# Patient Record
Sex: Female | Born: 1990 | Race: White | Hispanic: No | Marital: Married | Smoking: Never smoker
Health system: Southern US, Community
[De-identification: ages and names within clinical notes are randomized; demographics above are authoritative.]

## PROBLEM LIST (undated history)

## (undated) DIAGNOSIS — R112 Nausea with vomiting, unspecified: Secondary | ICD-10-CM

## (undated) DIAGNOSIS — Z9889 Other specified postprocedural states: Secondary | ICD-10-CM

## (undated) DIAGNOSIS — E039 Hypothyroidism, unspecified: Secondary | ICD-10-CM

## (undated) DIAGNOSIS — R87629 Unspecified abnormal cytological findings in specimens from vagina: Secondary | ICD-10-CM

## (undated) HISTORY — PX: NASAL SEPTUM SURGERY: SHX37

## (undated) HISTORY — PX: CHOLECYSTECTOMY: SHX55

## (undated) HISTORY — DX: Unspecified abnormal cytological findings in specimens from vagina: R87.629

---
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
## 2016-05-12 ENCOUNTER — Other Ambulatory Visit: Payer: Self-pay | Admitting: Endocrinology

## 2016-05-12 DIAGNOSIS — E010 Iodine-deficiency related diffuse (endemic) goiter: Secondary | ICD-10-CM

## 2016-05-26 ENCOUNTER — Ambulatory Visit
Admission: RE | Admit: 2016-05-26 | Discharge: 2016-05-26 | Disposition: A | Discharge status: Home / Self Care | Payer: Managed Care, Other (non HMO) | Source: Ambulatory Visit | Attending: Endocrinology | Admitting: Endocrinology

## 2016-05-26 DIAGNOSIS — E010 Iodine-deficiency related diffuse (endemic) goiter: Secondary | ICD-10-CM

## 2016-08-30 ENCOUNTER — Other Ambulatory Visit: Payer: Self-pay | Admitting: Gastroenterology

## 2016-08-30 DIAGNOSIS — R112 Nausea with vomiting, unspecified: Secondary | ICD-10-CM

## 2016-09-08 ENCOUNTER — Encounter (HOSPITAL_COMMUNITY)
Admission: RE | Admit: 2016-09-08 | Discharge: 2016-09-08 | Disposition: A | Discharge status: Home / Self Care | Payer: Commercial Managed Care - PPO | Source: Ambulatory Visit | Attending: Gastroenterology | Admitting: Gastroenterology

## 2016-09-08 DIAGNOSIS — R112 Nausea with vomiting, unspecified: Secondary | ICD-10-CM | POA: Insufficient documentation

## 2016-09-08 MED ORDER — TECHNETIUM TC 99M MEBROFENIN IV KIT: 5.2000 | PACK | Freq: Once | INTRAVENOUS | Status: DC | PRN

## 2016-09-14 ENCOUNTER — Encounter (HOSPITAL_COMMUNITY)
Admission: RE | Admit: 2016-09-14 | Discharge: 2016-09-14 | Disposition: A | Discharge status: Home / Self Care | Payer: Commercial Managed Care - PPO | Source: Ambulatory Visit | Attending: Gastroenterology | Admitting: Gastroenterology

## 2016-09-14 ENCOUNTER — Encounter (HOSPITAL_COMMUNITY): Payer: Self-pay | Admitting: Radiology

## 2016-09-14 DIAGNOSIS — R112 Nausea with vomiting, unspecified: Secondary | ICD-10-CM | POA: Insufficient documentation

## 2016-09-14 MED ORDER — TECHNETIUM TC 99M SULFUR COLLOID: 2.0000 | Freq: Once | INTRAVENOUS | Status: DC | PRN

## 2016-10-14 ENCOUNTER — Other Ambulatory Visit: Payer: Self-pay | Admitting: Surgery

## 2016-10-27 ENCOUNTER — Ambulatory Visit (HOSPITAL_COMMUNITY)
Admission: RE | Admit: 2016-10-27 | Discharge: 2016-10-27 | Disposition: A | Discharge status: Home / Self Care | Payer: Commercial Managed Care - PPO | Source: Ambulatory Visit | Attending: Surgery | Admitting: Surgery

## 2016-10-27 ENCOUNTER — Other Ambulatory Visit (HOSPITAL_COMMUNITY): Payer: Self-pay | Admitting: Surgery

## 2016-10-27 DIAGNOSIS — M79605 Pain in left leg: Secondary | ICD-10-CM

## 2016-10-27 DIAGNOSIS — M7989 Other specified soft tissue disorders: Principal | ICD-10-CM

## 2016-10-27 DIAGNOSIS — Z9049 Acquired absence of other specified parts of digestive tract: Secondary | ICD-10-CM | POA: Diagnosis present

## 2016-10-27 NOTE — Progress Notes (Signed)
*  PRELIMINARY RESULTS* Vascular Ultrasound Left lower extremity venous duplex has been completed.  Preliminary findings: No evidence of deep vein thrombosis or baker's cysts in the left lower extremity.  Preliminary report called to Dr. Doroteo GlassmanBlackmon's office and given to [facility] @ 16:40.   Chauncey FischerCharlotte C Dewell Monnier 10/27/2016, 4:39 PM

## 2017-03-24 IMAGING — US US ABDOMEN COMPLETE
1 series · 14 of 25 positions shown · non-contrast
Comparison: None.

CLINICAL DATA: Nausea, vomiting for several years

EXAM:
ABDOMEN ULTRASOUND COMPLETE

[Series 1: us abdomen complete · 0.20mm/px · 14 of 113 slices shown]
[im 1/113]
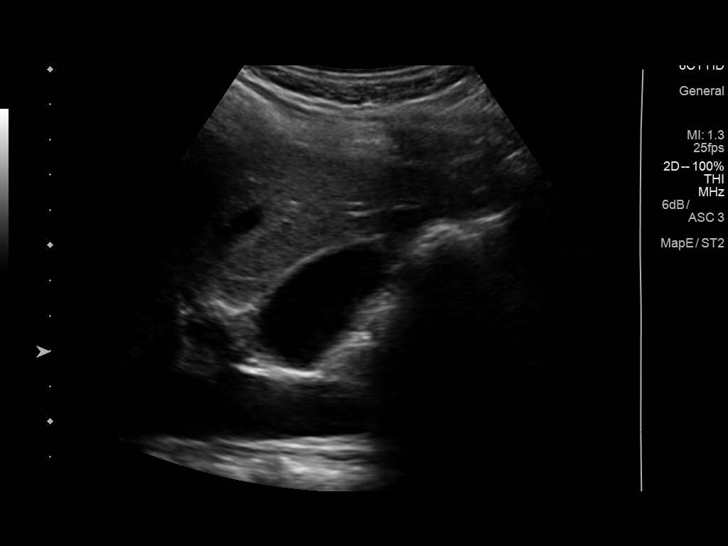
[im 10/113]
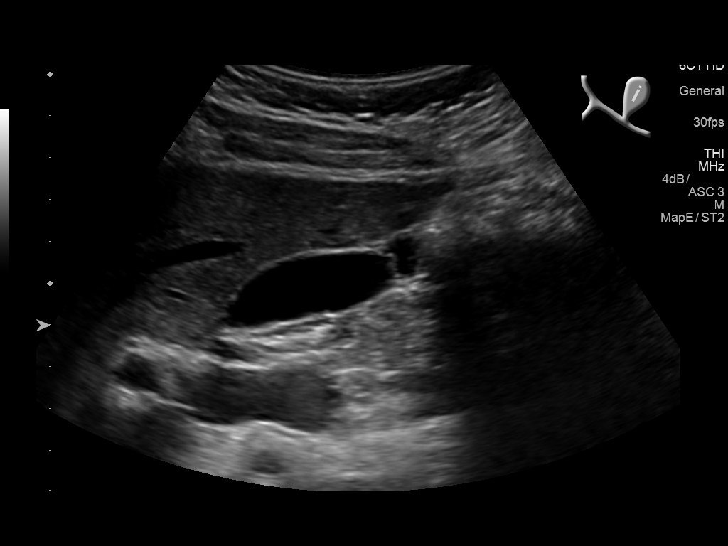
[im 19/113]
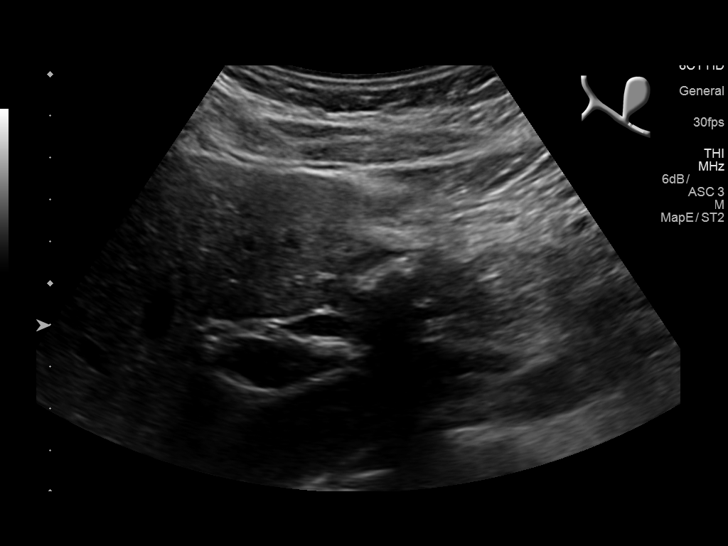
[im 29/113]
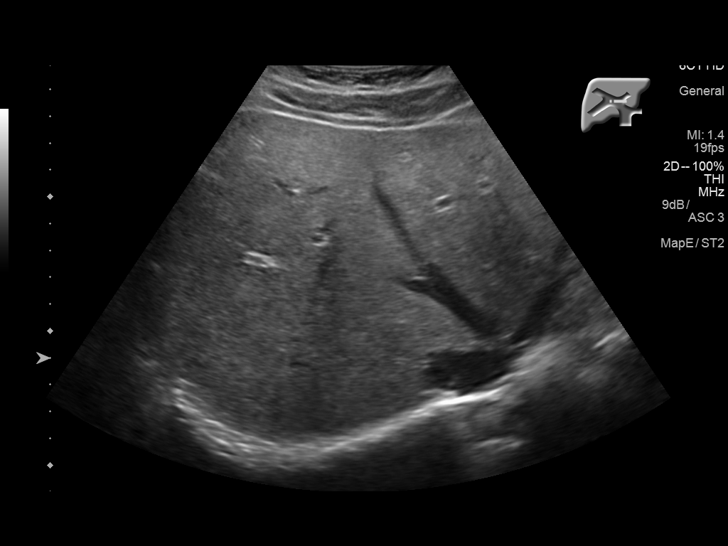
[im 38/113]
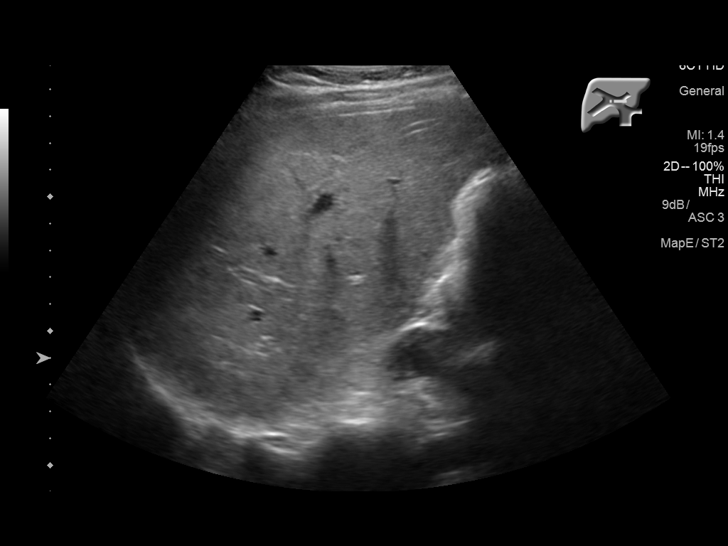
[im 43/113]
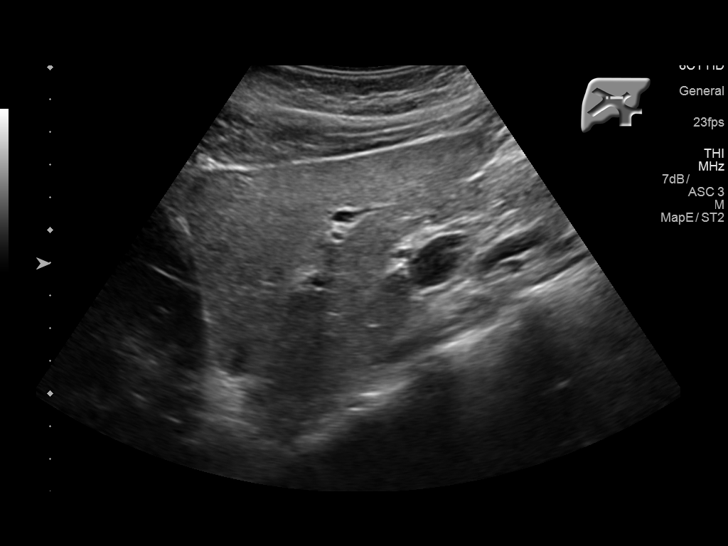
[im 52/113]
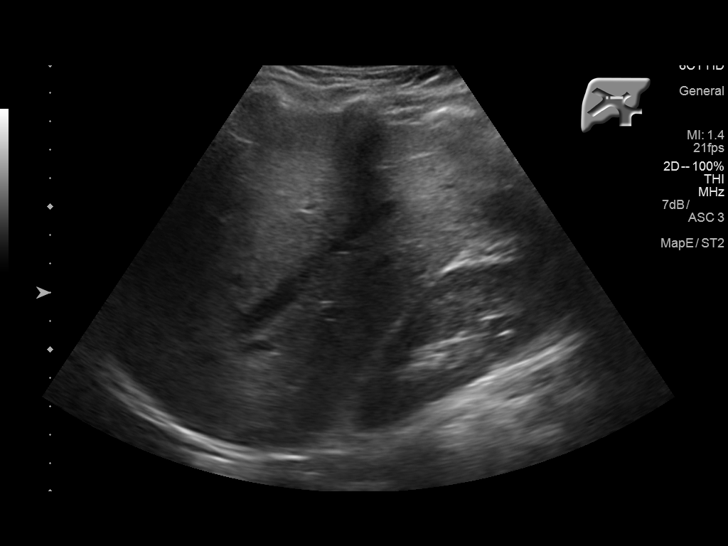
[im 61/113]
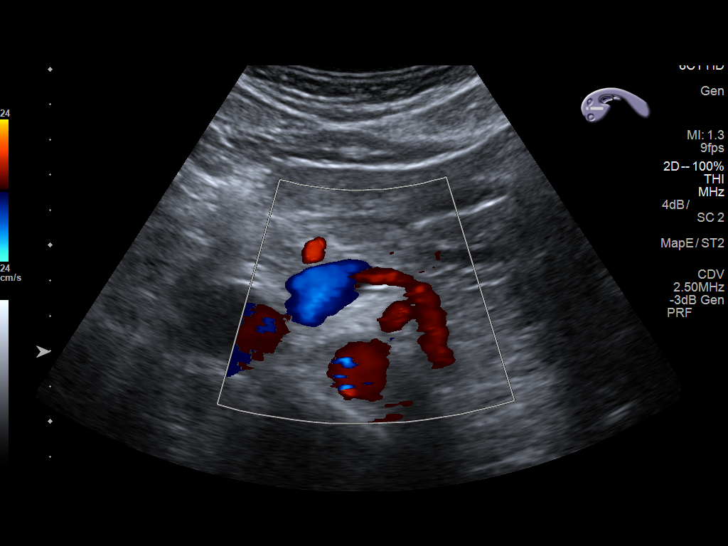
[im 71/113]
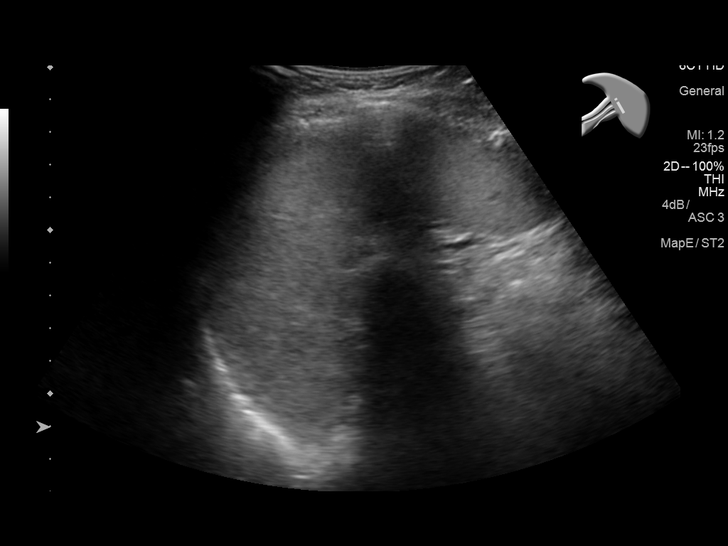
[im 75/113]
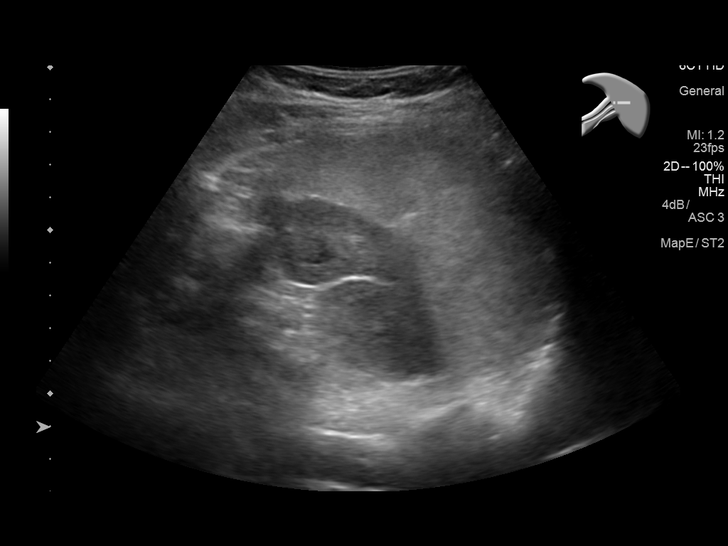
[im 85/113]
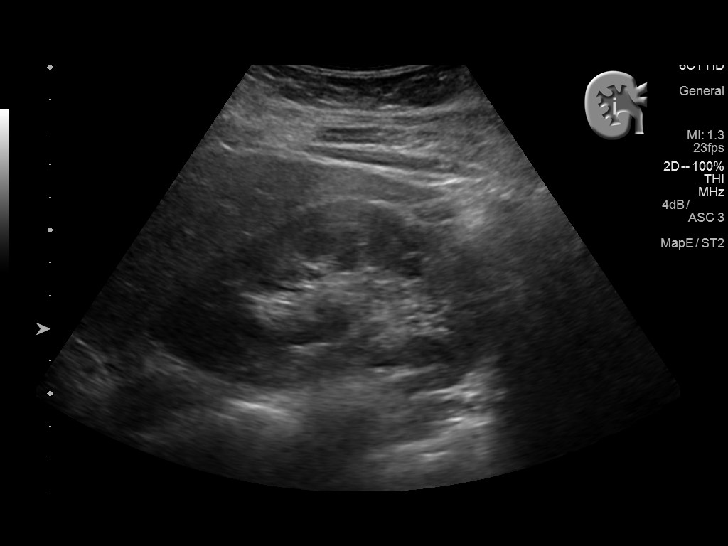
[im 94/113]
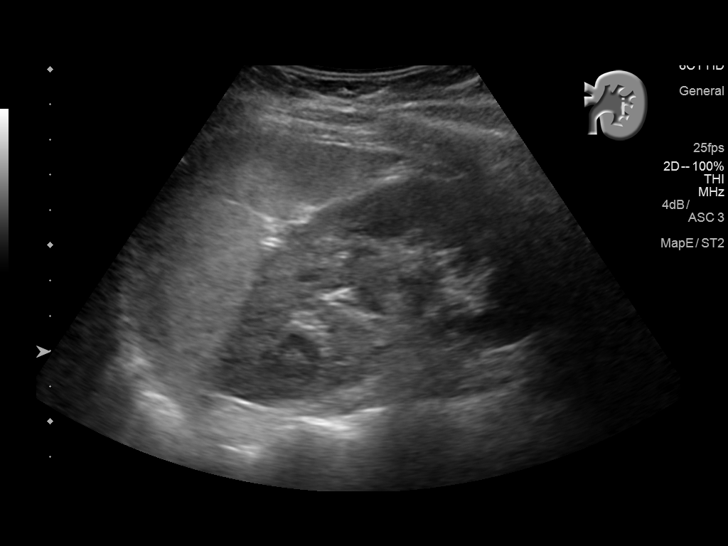
[im 103/113]
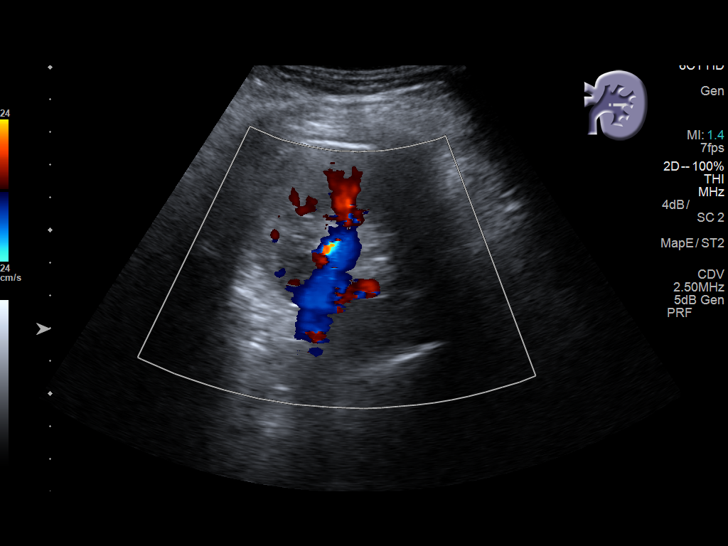
[im 113/113]
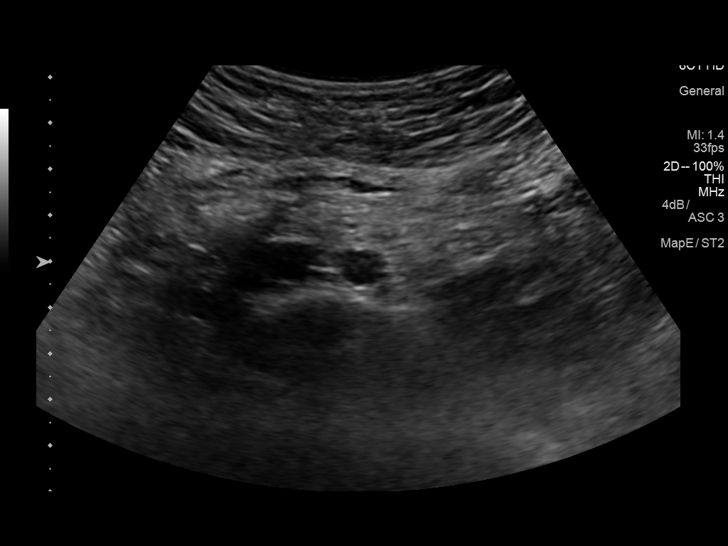

[14 of 25 positions shown; findings below may reference images not displayed]

FINDINGS: Gallbladder: The gallbladder is visualized and no gallstones are
noted. There is no pain over the gallbladder with compression.

Common bile duct: Diameter: The common bile duct is normal measuring
4.1 mm in diameter.

Liver: The liver has a normal echogenic pattern. No focal hepatic
abnormality is seen.

IVC: No abnormality visualized.

Pancreas: The pancreas is moderately well seen with no abnormality
noted. No pancreatic ductal dilatation is seen.

Spleen: The spleen measures 10.5 cm.

Right Kidney: Length: 11.5 cm..  No hydronephrosis is seen.

Left Kidney: Length: 11.2 cm..  No hydronephrosis is noted.

Abdominal aorta: The abdominal aorta is normal caliber.

Other findings: None.
IMPRESSION: Negative abdominal ultrasound.

## 2017-06-22 ENCOUNTER — Other Ambulatory Visit: Payer: Self-pay

## 2017-06-22 ENCOUNTER — Encounter (HOSPITAL_COMMUNITY): Payer: Self-pay | Admitting: *Deleted

## 2017-06-24 NOTE — H&P (Addendum)
Wendy Mccullough is an 26 y.o. female. Presenting for a D&C for a MAB measuring ~ 8.4wga.   Pertinent Gynecological History:  Last pap 2106 NIL OB History: G3P2002   Menstrual History: No LMP recorded.    Past Medical History:  Diagnosis Date  . Hypothyroidism     Social History:  reports that  has never smoked. she has never used smokeless tobacco. She reports that she does not drink alcohol or use drugs.  Allergies:  Allergies  Allergen Reactions  . Morphine Other (See Comments)    migraine    No medications prior to admission.    ROS  Height 5\' 7"  (1.702 m), weight 71.2 kg (157 lb). Physical Exam Gen: well appearing, NAD CV: Reg rate Pulm: NWOB Abd: soft, nondistended, nontender, no masses Ext: No edema b/l  No results found for this or any previous visit (from the past 24 hour(s)).  No results found.  Assessment/Plan: 26yo [id number] presenting w/MAB measuring 8.4wga. She was counseled on all of her options and opted for a D&C. Understands risks which include infection, bleeding, damage to uterus or surrounding structures, and the need for additional procedures. She gave informed consent. She is RH+. Plan for doxycycline 100mg  BID x 3 days postop.    Ranae Pilalise Jennifer Dublin Grayer 06/24/2017, 12:35 PM   No updates EL

## 2017-06-26 ENCOUNTER — Ambulatory Visit (HOSPITAL_COMMUNITY): Payer: Commercial Managed Care - PPO | Admitting: Anesthesiology

## 2017-06-26 ENCOUNTER — Encounter (HOSPITAL_COMMUNITY)
Admission: RE | Disposition: A | Discharge status: Home / Self Care | Payer: Self-pay | Source: Ambulatory Visit | Attending: Obstetrics and Gynecology

## 2017-06-26 ENCOUNTER — Ambulatory Visit (HOSPITAL_COMMUNITY)
Admission: RE | Admit: 2017-06-26 | Discharge: 2017-06-26 | Disposition: A | Discharge status: Home / Self Care | Payer: Commercial Managed Care - PPO | Source: Ambulatory Visit | Attending: Obstetrics and Gynecology | Admitting: Obstetrics and Gynecology

## 2017-06-26 ENCOUNTER — Encounter (HOSPITAL_COMMUNITY): Payer: Self-pay

## 2017-06-26 DIAGNOSIS — O021 Missed abortion: Secondary | ICD-10-CM | POA: Diagnosis present

## 2017-06-26 DIAGNOSIS — Z885 Allergy status to narcotic agent status: Secondary | ICD-10-CM | POA: Diagnosis not present

## 2017-06-26 HISTORY — PX: DILATION AND EVACUATION: SHX1459

## 2017-06-26 HISTORY — DX: Other specified postprocedural states: Z98.890

## 2017-06-26 HISTORY — DX: Nausea with vomiting, unspecified: R11.2

## 2017-06-26 HISTORY — DX: Hypothyroidism, unspecified: E03.9

## 2017-06-26 LAB — CBC
HEMATOCRIT: 36.2 % (ref 36.0–46.0)
HEMOGLOBIN: 13.2 g/dL (ref 12.0–15.0)
MCH: 31 pg (ref 26.0–34.0)
MCHC: 36.5 g/dL — AB (ref 30.0–36.0)
MCV: 85 fL (ref 78.0–100.0)
Platelets: 214 10*3/uL (ref 150–400)
RBC: 4.26 MIL/uL (ref 3.87–5.11)
RDW: 12.2 % (ref 11.5–15.5)
WBC: 6 10*3/uL (ref 4.0–10.5)

## 2017-06-26 SURGERY — DILATION AND EVACUATION, UTERUS
Anesthesia: Monitor Anesthesia Care | Site: Vagina

## 2017-06-26 MED ORDER — LACTATED RINGERS IV SOLN
INTRAVENOUS | Status: DC
  Administered 2017-06-26: 12:00:00 via INTRAVENOUS

## 2017-06-26 MED ORDER — DOXYCYCLINE HYCLATE 100 MG PO TABS: 100.0000 mg | ORAL_TABLET | Freq: Two times a day (BID) | ORAL | 0 refills | Status: DC

## 2017-06-26 MED ORDER — FENTANYL CITRATE (PF) 100 MCG/2ML IJ SOLN
INTRAMUSCULAR | Status: AC
  Filled 2017-06-26: qty 2

## 2017-06-26 MED ORDER — DEXAMETHASONE SODIUM PHOSPHATE 4 MG/ML IJ SOLN
INTRAMUSCULAR | Status: AC
Start: 2017-06-26 — End: ?
  Filled 2017-06-26: qty 1

## 2017-06-26 MED ORDER — CHLOROPROCAINE HCL 1 % IJ SOLN
INTRAMUSCULAR | Status: AC
  Filled 2017-06-26: qty 30

## 2017-06-26 MED ORDER — LIDOCAINE HCL (CARDIAC) 20 MG/ML IV SOLN
INTRAVENOUS | Status: AC
  Filled 2017-06-26: qty 5

## 2017-06-26 MED ORDER — MIDAZOLAM HCL 2 MG/2ML IJ SOLN
INTRAMUSCULAR | Status: DC | PRN
  Administered 2017-06-26: 2 mg via INTRAVENOUS

## 2017-06-26 MED ORDER — LIDOCAINE HCL (CARDIAC) 20 MG/ML IV SOLN
INTRAVENOUS | Status: DC | PRN
  Administered 2017-06-26: 30 mg via INTRAVENOUS

## 2017-06-26 MED ORDER — KETOROLAC TROMETHAMINE 30 MG/ML IJ SOLN
INTRAMUSCULAR | Status: AC
  Filled 2017-06-26: qty 1

## 2017-06-26 MED ORDER — FENTANYL CITRATE (PF) 100 MCG/2ML IJ SOLN
INTRAMUSCULAR | Status: DC | PRN
  Administered 2017-06-26 (×2): 50 ug via INTRAVENOUS

## 2017-06-26 MED ORDER — CHLOROPROCAINE HCL 1 % IJ SOLN
INTRAMUSCULAR | Status: DC | PRN
  Administered 2017-06-26: 10 mL

## 2017-06-26 MED ORDER — KETOROLAC TROMETHAMINE 30 MG/ML IJ SOLN: 30.0000 mg | Freq: Once | INTRAMUSCULAR | Status: DC | PRN

## 2017-06-26 MED ORDER — PROPOFOL 500 MG/50ML IV EMUL
INTRAVENOUS | Status: DC | PRN
  Administered 2017-06-26: 20 mg via INTRAVENOUS
  Administered 2017-06-26 (×4): 30 mg via INTRAVENOUS
  Administered 2017-06-26: 20 mg via INTRAVENOUS
  Administered 2017-06-26: 40 mg via INTRAVENOUS

## 2017-06-26 MED ORDER — DEXAMETHASONE SODIUM PHOSPHATE 10 MG/ML IJ SOLN
INTRAMUSCULAR | Status: DC | PRN
  Administered 2017-06-26: 4 mg via INTRAVENOUS

## 2017-06-26 MED ORDER — HYDROMORPHONE HCL 1 MG/ML IJ SOLN: 0.2500 mg | INTRAMUSCULAR | Status: DC | PRN

## 2017-06-26 MED ORDER — PROMETHAZINE HCL 25 MG/ML IJ SOLN: 6.2500 mg | INTRAMUSCULAR | Status: DC | PRN

## 2017-06-26 MED ORDER — ONDANSETRON HCL 4 MG/2ML IJ SOLN
INTRAMUSCULAR | Status: AC
  Filled 2017-06-26: qty 2

## 2017-06-26 MED ORDER — KETOROLAC TROMETHAMINE 30 MG/ML IJ SOLN
INTRAMUSCULAR | Status: DC | PRN
  Administered 2017-06-26: 30 mg via INTRAVENOUS

## 2017-06-26 MED ORDER — PROPOFOL 10 MG/ML IV BOLUS
INTRAVENOUS | Status: AC
  Filled 2017-06-26: qty 40

## 2017-06-26 MED ORDER — SCOPOLAMINE 1 MG/3DAYS TD PT72
1.0000 | MEDICATED_PATCH | Freq: Once | TRANSDERMAL | Status: DC
  Administered 2017-06-26: 1.5 mg via TRANSDERMAL

## 2017-06-26 MED ORDER — SCOPOLAMINE 1 MG/3DAYS TD PT72
MEDICATED_PATCH | TRANSDERMAL | Status: AC
  Administered 2017-06-26: 1.5 mg via TRANSDERMAL
  Filled 2017-06-26: qty 1

## 2017-06-26 MED ORDER — MIDAZOLAM HCL 2 MG/2ML IJ SOLN
INTRAMUSCULAR | Status: AC
  Filled 2017-06-26: qty 2

## 2017-06-26 MED ORDER — ONDANSETRON HCL 4 MG/2ML IJ SOLN
INTRAMUSCULAR | Status: DC | PRN
  Administered 2017-06-26: 4 mg via INTRAVENOUS

## 2017-06-26 SURGICAL SUPPLY — 17 items
CATH ROBINSON RED A/P 16FR (CATHETERS) ×2 IMPLANT
DECANTER SPIKE VIAL GLASS SM (MISCELLANEOUS) ×2 IMPLANT
GLOVE BIO SURGEON STRL SZ 6.5 (GLOVE) ×2 IMPLANT
GLOVE BIOGEL PI IND STRL 7.0 (GLOVE) ×2 IMPLANT
GLOVE BIOGEL PI INDICATOR 7.0 (GLOVE) ×2
GOWN STRL REUS W/TWL LRG LVL3 (GOWN DISPOSABLE) ×4 IMPLANT
KIT BERKELEY 1ST TRIMESTER 3/8 (MISCELLANEOUS) ×2 IMPLANT
NS IRRIG 1000ML POUR BTL (IV SOLUTION) ×2 IMPLANT
PACK VAGINAL MINOR WOMEN LF (CUSTOM PROCEDURE TRAY) ×2 IMPLANT
PAD OB MATERNITY 4.3X12.25 (PERSONAL CARE ITEMS) ×2 IMPLANT
PAD PREP 24X48 CUFFED NSTRL (MISCELLANEOUS) ×2 IMPLANT
SET BERKELEY SUCTION TUBING (SUCTIONS) ×2 IMPLANT
TOWEL OR 17X24 6PK STRL BLUE (TOWEL DISPOSABLE) ×4 IMPLANT
VACURETTE 10 RIGID CVD (CANNULA) IMPLANT
VACURETTE 7MM CVD STRL WRAP (CANNULA) IMPLANT
VACURETTE 8 RIGID CVD (CANNULA) ×2 IMPLANT
VACURETTE 9 RIGID CVD (CANNULA) IMPLANT

## 2017-06-26 NOTE — Op Note (Signed)
PREOPERATIVE DIAGNOSES: 1. Missed Abortion  POSTOPERATIVE DIAGNOSES: Same  PROCEDURE PERFORMED: Dilation, suction, sharp curretage  SURGEON: Dr. Belva AgeeElise Leevi Cullars  ANESTHESIA: Paracervical block and IV sedation  ESTIMATED BLOOD LOSS:  Minimal  COMPLICATIONS: None  TUBES: None.  DRAINS: None  PATHOLOGY: Endometrial curettings and POC  FINDINGS: On exam, under anesthesia, normal appearing vulva and vagina, 8 week sized uterus  Operative findings demonstrated plethora of POCs  Procedure: The patient was taken to the operating room where she was properly prepped and draped in sterile manner under general anesthesia. After bimanual examination, the cervix was exposed with a speculum and the anterior lip of the cervix grasped with a tenaculum. Paracervical block performed. The endocervical canal was then progressively dilated to 8mm. Suction catheter was introduced into the uterus and to the uterine fundus. The uterus was evacuated and good tissue return was noted. A sharp curettage was then performed until gritty texture noted. All instruments were removed from vagina. The sponge and lap counts were correct times 2 at this time. The patient's procedure was terminated. We then awakened her. She was sent to the Recovery Room in good condition.    Belva AgeeElise Ranveer Wahlstrom MD

## 2017-06-26 NOTE — Transfer of Care (Signed)
Immediate Anesthesia Transfer of Care Note  Patient: Wendy Mccullough  Procedure(s) Performed: DILATATION AND EVACUATION (N/A Vagina )  Patient Location: PACU  Anesthesia Type:MAC  Level of Consciousness: awake, alert , oriented and patient cooperative  Airway & Oxygen Therapy: Patient Spontanous Breathing and Patient connected to nasal cannula oxygen  Post-op Assessment: Report given to RN and Post -op Vital signs reviewed and stable  Post vital signs: Reviewed and stable  Last Vitals:  Vitals:   06/26/17 1143  BP: 106/82  Pulse: 68  Resp: 16  Temp: 37.1 C  SpO2: 100%    Last Pain:  Vitals:   06/26/17 1143  TempSrc: Oral      Patients Stated Pain Goal: 3 (07/05/30 3557)  Complications: No apparent anesthesia complications

## 2017-06-26 NOTE — Anesthesia Preprocedure Evaluation (Signed)
Anesthesia Evaluation  Patient identified by MRN, date of birth, ID band Patient awake    Reviewed: Allergy & Precautions, NPO status , Patient's Chart, lab work & pertinent test results  Airway Mallampati: II  TM Distance: >3 FB Neck ROM: Full    Dental   Pulmonary neg pulmonary ROS,    breath sounds clear to auscultation       Cardiovascular negative cardio ROS   Rhythm:Regular Rate:Normal     Neuro/Psych negative neurological ROS     GI/Hepatic negative GI ROS, Neg liver ROS,   Endo/Other  Hypothyroidism   Renal/GU negative Renal ROS     Musculoskeletal   Abdominal   Peds  Hematology negative hematology ROS (+)   Anesthesia Other Findings   Reproductive/Obstetrics Missed AB                             Anesthesia Physical Anesthesia Plan  ASA: II  Anesthesia Plan: MAC   Post-op Pain Management:    Induction: Intravenous  PONV Risk Score and Plan: Propofol infusion, Ondansetron, Midazolam and Treatment may vary due to age or medical condition  Airway Management Planned: Natural Airway and Simple Face Mask  Additional Equipment:   Intra-op Plan:   Post-operative Plan:   Informed Consent: I have reviewed the patients History and Physical, chart, labs and discussed the procedure including the risks, benefits and alternatives for the proposed anesthesia with the patient or authorized representative who has indicated his/her understanding and acceptance.     Plan Discussed with: CRNA  Anesthesia Plan Comments:         Anesthesia Quick Evaluation

## 2017-06-26 NOTE — Discharge Instructions (Signed)
Do NOT take Motrin/Ibuprofen/Advil until after 6:45pm tonight.    General Anesthesia, Adult, Care After These instructions provide you with information about caring for yourself after your procedure. Your health care provider may also give you more specific instructions. Your treatment has been planned according to current medical practices, but problems sometimes occur. Call your health care provider if you have any problems or questions after your procedure. What can I expect after the procedure? After the procedure, it is common to have:  Vomiting.  A sore throat.  Mental slowness.  It is common to feel:  Nauseous.  Cold or shivery.  Sleepy.  Tired.  Sore or achy, even in parts of your body where you did not have surgery.  Follow these instructions at home: For at least 24 hours after the procedure:  Do not: ? Participate in activities where you could fall or become injured. ? Drive. ? Use heavy machinery. ? Drink alcohol. ? Take sleeping pills or medicines that cause drowsiness. ? Make important decisions or sign legal documents. ? Take care of children on your own.  Rest. Eating and drinking  If you vomit, drink water, juice, or soup when you can drink without vomiting.  Drink enough fluid to keep your urine clear or pale yellow.  Make sure you have little or no nausea before eating solid foods.  Follow the diet recommended by your health care provider. General instructions  Have a responsible adult stay with you until you are awake and alert.  Return to your normal activities as told by your health care provider. Ask your health care provider what activities are safe for you.  Take over-the-counter and prescription medicines only as told by your health care provider.  If you smoke, do not smoke without supervision.  Keep all follow-up visits as told by your health care provider. This is important. Contact a health care provider if:  You continue to  have nausea or vomiting at home, and medicines are not helpful.  You cannot drink fluids or start eating again.  You cannot urinate after 8-12 hours.  You develop a skin rash.  You have fever.  You have increasing redness at the site of your procedure. Get help right away if:  You have difficulty breathing.  You have chest pain.  You have unexpected bleeding.  You feel that you are having a life-threatening or urgent problem. This information is not intended to replace advice given to you by your health care provider. Make sure you discuss any questions you have with your health care provider. Document Released: 09/19/2000 Document Revised: 11/16/2015 Document Reviewed: 05/28/2015 Elsevier Interactive Patient Education  2018 [employer] Dilation and Curettage or Vacuum Curettage, Care After These instructions give you information about caring for yourself after your procedure. Your doctor may also give you more specific instructions. Call your doctor if you have any problems or questions after your procedure. Follow these instructions at home: Activity  Do not drive or use heavy machinery while taking prescription pain medicine.  For 24 hours after your procedure, avoid driving.  Take short walks often, followed by rest periods. Ask your doctor what activities are safe for you. After one or two days, you may be able to return to your normal activities.  Do not lift anything that is heavier than 10 lb (4.5 kg) until your doctor approves.  For at least 2 weeks, or as long as told by your doctor: ? Do not douche. ? Do not use tampons. ?  Do not have sex. General instructions  Take over-the-counter and prescription medicines only as told by your doctor. This is very important if you take blood thinning medicine.  Do not take baths, swim, or use a hot tub until your doctor approves. Take showers instead of baths.  Wear compression stockings as told by your doctor.  It is  up to you to get the results of your procedure. Ask your doctor when your results will be ready.  Keep all follow-up visits as told by your doctor. This is important. Contact a doctor if:  You have very bad cramps that get worse or do not get better with medicine.  You have very bad pain in your belly (abdomen).  You cannot drink fluids without throwing up (vomiting).  You get pain in a different part of the area between your belly and thighs (pelvis).  You have bad-smelling discharge from your vagina.  You have a rash. Get help right away if:  You are bleeding a lot from your vagina. A lot of bleeding means soaking more than one sanitary pad in an hour, for 2 hours in a row.  You have clumps of blood (blood clots) coming from your vagina.  You have a fever or chills.  Your belly feels very tender or hard.  You have chest pain.  You have trouble breathing.  You cough up blood.  You feel dizzy.  You feel light-headed.  You pass out (faint).  You have pain in your neck or shoulder area. Summary  Take short walks often, followed by rest periods. Ask your doctor what activities are safe for you. After one or two days, you may be able to return to your normal activities.  Do not lift anything that is heavier than 10 lb (4.5 kg) until your doctor approves.  Do not take baths, swim, or use a hot tub until your doctor approves. Take showers instead of baths.  Contact your doctor if you have any symptoms of infection, like bad-smelling discharge from your vagina. This information is not intended to replace advice given to you by your health care provider. Make sure you discuss any questions you have with your health care provider. Document Released: 03/22/2008 Document Revised: 02/29/2016 Document Reviewed: 02/29/2016 Elsevier Interactive Patient Education  2017 [employer]

## 2017-06-26 NOTE — Brief Op Note (Signed)
06/26/2017  1:17 PM  PATIENT:  Wendy SacksSabrina Mccullough  26 y.o. female  PRE-OPERATIVE DIAGNOSIS:  MISSED AB  POST-OPERATIVE DIAGNOSIS:  MISSED AB  PROCEDURE:  Procedure(s): DILATATION AND EVACUATION (N/A)  SURGEON:  Surgeon(s) and Role:    * Leger, Madelaine EtienneElise Jennifer, MD - Primary  PHYSICIAN ASSISTANT:   ASSISTANTS: none   ANESTHESIA:   IV sedation and paracervical block  EBL:   Minimal   BLOOD ADMINISTERED:Source of Specimen:  POC  DRAINS: none   LOCAL MEDICATIONS USED:  Amount: 10 ml nesicaine  SPECIMEN:  Source of Specimen:  POC  DISPOSITION OF SPECIMEN:  PATHOLOGY  COUNTS:  YES  TOURNIQUET:  * No tourniquets in log *  DICTATION: .Note written in EPIC  PLAN OF CARE: Discharge to home after PACU  PATIENT DISPOSITION:  PACU - hemodynamically stable.   Delay start of Pharmacological VTE agent (>24hrs) due to surgical blood loss or risk of bleeding: not applicable

## 2017-06-26 NOTE — Anesthesia Postprocedure Evaluation (Signed)
Anesthesia Post Note  Patient: Wendy Mccullough  Procedure(s) Performed: DILATATION AND EVACUATION (N/A Vagina )     Patient location during evaluation: PACU Anesthesia Type: MAC Level of consciousness: awake and alert Pain management: pain level controlled Vital Signs Assessment: post-procedure vital signs reviewed and stable Respiratory status: spontaneous breathing, nonlabored ventilation, respiratory function stable and patient connected to nasal cannula oxygen Cardiovascular status: stable and blood pressure returned to baseline Postop Assessment: no apparent nausea or vomiting Anesthetic complications: no    Last Vitals:  Vitals:   06/26/17 1330 06/26/17 1345  BP: 109/66 102/62  Pulse: 68 75  Resp: 14 15  Temp:    SpO2: 100% (!) 87%    Last Pain:  Vitals:   06/26/17 1345  TempSrc:   PainSc: 3    Pain Goal: Patients Stated Pain Goal: 3 (06/26/17 1143)               Tiajuana Amass

## 2017-06-27 ENCOUNTER — Encounter (HOSPITAL_COMMUNITY): Payer: Self-pay | Admitting: Obstetrics and Gynecology

## 2017-06-27 NOTE — L&D Delivery Note (Signed)
Delivery Note At 2:50 PM a viable female was delivered via Vaginal, Spontaneous .  APGAR: 9 9  weight pending .   Placenta status: spontaneously with 3 vessel cord, .  Cord:  with the following complications:none .  Cord pH: not obtained  Anesthesia:  Nitrous, local Episiotomy:  none Lacerations: first  Suture Repair: 3.0 chromic Est. Blood Loss (mL):  300  Mom to postpartum.  Baby to Couplet care / Skin to Skin.  Wendy Mccullough L 04/26/2018, 3:05 PM

## 2017-09-20 LAB — OB RESULTS CONSOLE GC/CHLAMYDIA
CHLAMYDIA, DNA PROBE: NEGATIVE
GC PROBE AMP, GENITAL: NEGATIVE

## 2017-09-20 LAB — OB RESULTS CONSOLE RPR: RPR: NONREACTIVE

## 2017-09-20 LAB — OB RESULTS CONSOLE HEPATITIS B SURFACE ANTIGEN: HEP B S AG: NEGATIVE

## 2017-09-20 LAB — OB RESULTS CONSOLE ABO/RH: RH TYPE: POSITIVE

## 2017-09-20 LAB — OB RESULTS CONSOLE RUBELLA ANTIBODY, IGM: Rubella: IMMUNE

## 2017-09-20 LAB — OB RESULTS CONSOLE ANTIBODY SCREEN: Antibody Screen: NEGATIVE

## 2017-09-20 LAB — OB RESULTS CONSOLE HIV ANTIBODY (ROUTINE TESTING): HIV: NONREACTIVE

## 2018-03-29 LAB — OB RESULTS CONSOLE GBS: GBS: NEGATIVE

## 2018-04-17 ENCOUNTER — Telehealth (HOSPITAL_COMMUNITY): Payer: Self-pay | Admitting: *Deleted

## 2018-04-17 ENCOUNTER — Encounter (HOSPITAL_COMMUNITY): Payer: Self-pay | Admitting: *Deleted

## 2018-04-17 NOTE — Telephone Encounter (Signed)
Preadmission screen  

## 2018-04-25 ENCOUNTER — Inpatient Hospital Stay (HOSPITAL_COMMUNITY): Admission: RE | Admit: 2018-04-25 | Payer: Commercial Managed Care - PPO | Source: Ambulatory Visit

## 2018-04-26 ENCOUNTER — Inpatient Hospital Stay (HOSPITAL_COMMUNITY)
Admission: RE | Admit: 2018-04-26 | Discharge: 2018-04-27 | DRG: 807 | Disposition: A | Discharge status: Home / Self Care | Payer: Commercial Managed Care - PPO | Attending: Obstetrics and Gynecology | Admitting: Obstetrics and Gynecology

## 2018-04-26 ENCOUNTER — Other Ambulatory Visit: Payer: Self-pay

## 2018-04-26 ENCOUNTER — Encounter (HOSPITAL_COMMUNITY): Payer: Self-pay

## 2018-04-26 DIAGNOSIS — Z3483 Encounter for supervision of other normal pregnancy, third trimester: Secondary | ICD-10-CM | POA: Diagnosis present

## 2018-04-26 DIAGNOSIS — Z3A39 39 weeks gestation of pregnancy: Secondary | ICD-10-CM

## 2018-04-26 DIAGNOSIS — Z3490 Encounter for supervision of normal pregnancy, unspecified, unspecified trimester: Secondary | ICD-10-CM | POA: Diagnosis present

## 2018-04-26 LAB — CBC
HEMATOCRIT: 32.8 % — AB (ref 36.0–46.0)
Hemoglobin: 11.6 g/dL — ABNORMAL LOW (ref 12.0–15.0)
MCH: 31.4 pg (ref 26.0–34.0)
MCHC: 35.4 g/dL (ref 30.0–36.0)
MCV: 88.9 fL (ref 80.0–100.0)
PLATELETS: 187 10*3/uL (ref 150–400)
RBC: 3.69 MIL/uL — ABNORMAL LOW (ref 3.87–5.11)
RDW: 14.1 % (ref 11.5–15.5)
WBC: 9 10*3/uL (ref 4.0–10.5)

## 2018-04-26 LAB — RPR: RPR: NONREACTIVE

## 2018-04-26 LAB — TYPE AND SCREEN
ABO/RH(D): O POS
Antibody Screen: NEGATIVE

## 2018-04-26 LAB — ABO/RH: ABO/RH(D): O POS

## 2018-04-26 MED ORDER — MEASLES, MUMPS & RUBELLA VAC ~~LOC~~ INJ: 0.5000 mL | INJECTION | Freq: Once | SUBCUTANEOUS | Status: DC

## 2018-04-26 MED ORDER — ACETAMINOPHEN 325 MG PO TABS: 650.0000 mg | ORAL_TABLET | ORAL | Status: DC | PRN

## 2018-04-26 MED ORDER — LACTATED RINGERS IV SOLN: 500.0000 mL | INTRAVENOUS | Status: DC | PRN

## 2018-04-26 MED ORDER — PRENATAL MULTIVITAMIN CH
1.0000 | ORAL_TABLET | Freq: Every day | ORAL | Status: DC
  Filled 2018-04-26: qty 1

## 2018-04-26 MED ORDER — LEVOTHYROXINE SODIUM 112 MCG PO TABS
112.0000 ug | ORAL_TABLET | Freq: Every day | ORAL | Status: DC
  Administered 2018-04-27: 112 ug via ORAL
  Filled 2018-04-26 (×2): qty 1

## 2018-04-26 MED ORDER — BISACODYL 10 MG RE SUPP: 10.0000 mg | Freq: Every day | RECTAL | Status: DC | PRN

## 2018-04-26 MED ORDER — OXYTOCIN 40 UNITS IN LACTATED RINGERS INFUSION - SIMPLE MED
1.0000 m[IU]/min | INTRAVENOUS | Status: DC
  Administered 2018-04-26: 2 m[IU]/min via INTRAVENOUS
  Filled 2018-04-26: qty 1000

## 2018-04-26 MED ORDER — COCONUT OIL OIL
1.0000 "application " | TOPICAL_OIL | Status: DC | PRN
  Administered 2018-04-27: 1 via TOPICAL
  Filled 2018-04-26: qty 120

## 2018-04-26 MED ORDER — OXYCODONE-ACETAMINOPHEN 5-325 MG PO TABS: 1.0000 | ORAL_TABLET | ORAL | Status: DC | PRN

## 2018-04-26 MED ORDER — SIMETHICONE 80 MG PO CHEW: 80.0000 mg | CHEWABLE_TABLET | ORAL | Status: DC | PRN

## 2018-04-26 MED ORDER — SOD CITRATE-CITRIC ACID 500-334 MG/5ML PO SOLN: 30.0000 mL | ORAL | Status: DC | PRN

## 2018-04-26 MED ORDER — OXYTOCIN BOLUS FROM INFUSION
500.0000 mL | Freq: Once | INTRAVENOUS | Status: AC
  Administered 2018-04-26: 500 mL via INTRAVENOUS

## 2018-04-26 MED ORDER — DIBUCAINE 1 % RE OINT: 1.0000 "application " | TOPICAL_OINTMENT | RECTAL | Status: DC | PRN

## 2018-04-26 MED ORDER — TERBUTALINE SULFATE 1 MG/ML IJ SOLN
0.2500 mg | Freq: Once | INTRAMUSCULAR | Status: DC | PRN
  Filled 2018-04-26: qty 1

## 2018-04-26 MED ORDER — ONDANSETRON HCL 4 MG/2ML IJ SOLN: 4.0000 mg | Freq: Four times a day (QID) | INTRAMUSCULAR | Status: DC | PRN

## 2018-04-26 MED ORDER — BENZOCAINE-MENTHOL 20-0.5 % EX AERO
1.0000 "application " | INHALATION_SPRAY | CUTANEOUS | Status: DC | PRN
  Administered 2018-04-26: 1 via TOPICAL
  Filled 2018-04-26: qty 56

## 2018-04-26 MED ORDER — ONDANSETRON HCL 4 MG/2ML IJ SOLN: 4.0000 mg | INTRAMUSCULAR | Status: DC | PRN

## 2018-04-26 MED ORDER — TETANUS-DIPHTH-ACELL PERTUSSIS 5-2.5-18.5 LF-MCG/0.5 IM SUSP: 0.5000 mL | Freq: Once | INTRAMUSCULAR | Status: DC

## 2018-04-26 MED ORDER — IBUPROFEN 600 MG PO TABS
600.0000 mg | ORAL_TABLET | Freq: Four times a day (QID) | ORAL | Status: DC
  Administered 2018-04-26 – 2018-04-27 (×4): 600 mg via ORAL
  Filled 2018-04-26 (×4): qty 1

## 2018-04-26 MED ORDER — BUTORPHANOL TARTRATE 1 MG/ML IJ SOLN
1.0000 mg | INTRAMUSCULAR | Status: DC | PRN
  Administered 2018-04-26: 1 mg via INTRAVENOUS
  Filled 2018-04-26: qty 1

## 2018-04-26 MED ORDER — WITCH HAZEL-GLYCERIN EX PADS: 1.0000 "application " | MEDICATED_PAD | CUTANEOUS | Status: DC | PRN

## 2018-04-26 MED ORDER — FLEET ENEMA 7-19 GM/118ML RE ENEM: 1.0000 | ENEMA | Freq: Every day | RECTAL | Status: DC | PRN

## 2018-04-26 MED ORDER — MEDROXYPROGESTERONE ACETATE 150 MG/ML IM SUSP: 150.0000 mg | INTRAMUSCULAR | Status: DC | PRN

## 2018-04-26 MED ORDER — ZOLPIDEM TARTRATE 5 MG PO TABS: 5.0000 mg | ORAL_TABLET | Freq: Every evening | ORAL | Status: DC | PRN

## 2018-04-26 MED ORDER — SENNOSIDES-DOCUSATE SODIUM 8.6-50 MG PO TABS
2.0000 | ORAL_TABLET | ORAL | Status: DC
  Administered 2018-04-26: 2 via ORAL
  Filled 2018-04-26: qty 2

## 2018-04-26 MED ORDER — LIDOCAINE HCL (PF) 1 % IJ SOLN
30.0000 mL | INTRAMUSCULAR | Status: DC | PRN
  Administered 2018-04-26: 30 mL via SUBCUTANEOUS
  Filled 2018-04-26: qty 30

## 2018-04-26 MED ORDER — ONDANSETRON HCL 4 MG PO TABS: 4.0000 mg | ORAL_TABLET | ORAL | Status: DC | PRN

## 2018-04-26 MED ORDER — OXYCODONE-ACETAMINOPHEN 5-325 MG PO TABS: 2.0000 | ORAL_TABLET | ORAL | Status: DC | PRN

## 2018-04-26 MED ORDER — OXYTOCIN 40 UNITS IN LACTATED RINGERS INFUSION - SIMPLE MED: 2.5000 [IU]/h | INTRAVENOUS | Status: DC

## 2018-04-26 MED ORDER — DIPHENHYDRAMINE HCL 25 MG PO CAPS: 25.0000 mg | ORAL_CAPSULE | Freq: Four times a day (QID) | ORAL | Status: DC | PRN

## 2018-04-26 MED ORDER — MISOPROSTOL 25 MCG QUARTER TABLET
25.0000 ug | ORAL_TABLET | ORAL | Status: DC | PRN
  Administered 2018-04-26: 25 ug via VAGINAL
  Filled 2018-04-26 (×3): qty 1

## 2018-04-26 MED ORDER — LACTATED RINGERS IV SOLN
INTRAVENOUS | Status: DC
  Administered 2018-04-26 (×2): via INTRAVENOUS

## 2018-04-26 NOTE — H&P (Signed)
Wendy Mccullough is a 27 y.o. G 4 P 2012 at 15 w and 3 days presents for 2 stage induction. Status post cytotec x 1 last night  OB History    Gravida  4   Para  2   Term  2   Preterm      AB  1   Living  2     SAB  1   TAB      Ectopic      Multiple      Live Births  2          Past Medical History:  Diagnosis Date  . Hypothyroidism   . PONV (postoperative nausea and vomiting)   . Vaginal Pap smear, abnormal    Past Surgical History:  Procedure Laterality Date  . CHOLECYSTECTOMY  09/1016  . DILATION AND EVACUATION N/A 06/26/2017   Procedure: DILATATION AND EVACUATION;  Surgeon: Ranae Pila, MD;  Location: [facility];  Service: Gynecology;  Laterality: N/A;  . NASAL SEPTUM SURGERY    . TONSILLECTOMY  2007   Family History: family history includes Diabetes in her maternal grandfather and maternal grandmother; Heart disease in her maternal grandmother; Hypertension in her mother; Pancreatic cancer in her paternal grandfather. Social History:  reports that she has never smoked. She has never used smokeless tobacco. She reports that she does not drink alcohol or use drugs.     Maternal Diabetes: No Genetic Screening: Normal Maternal Ultrasounds/Referrals: Normal Fetal Ultrasounds or other Referrals:  None Maternal Substance Abuse:  No Significant Maternal Medications:  None Significant Maternal Lab Results:  None Other Comments:  None  Review of Systems  All other systems reviewed and are negative.  History Dilation: 1.5 Effacement (%): 50 Station: -2 Exam by:: Dr. Vincente Poli Blood pressure 112/73, pulse 74, temperature 98.9 F (37.2 C), temperature source Oral, resp. rate 18, height 5\' 7"  (1.702 m), weight 80.7 kg, last menstrual period 07/24/2017, SpO2 99 %. Maternal Exam:  Abdomen: Fetal presentation: vertex     Fetal Exam Fetal State Assessment: Category I - tracings are normal.     Physical Exam  Nursing note and vitals  reviewed. Constitutional: She appears well-developed and well-nourished.  HENT:  Head: Normocephalic and atraumatic.  Eyes: Pupils are equal, round, and reactive to light.  Neck: Normal range of motion.  Cardiovascular: Normal rate and regular rhythm.  Respiratory: Effort normal.  GI: Soft.  Genitourinary: Vagina normal.    Prenatal labs: ABO, Rh: --/--/O POS, O POS Performed at [facility], [address] [place], [place] [address]  [phone]) Antibody: NEG (10/31 0034) Rubella: Immune (03/27 0000) RPR: Nonreactive (03/27 0000)  HBsAg: Negative (03/27 0000)  HIV: Non-reactive (03/27 0000)  GBS: Negative (10/03 0000)   Assessment/Plan: IUP at 39 w 3 days IOL Pitocin prn Epidural prn Anticipate NSVD  Wendy Mccullough L 04/26/2018, 8:21 AM

## 2018-04-26 NOTE — Anesthesia Pain Management Evaluation Note (Signed)
  CRNA Pain Management Visit Note  Patient: Wendy Mccullough, 27 y.o., female  "Hello I am a member of the anesthesia team at [facility]. We have an anesthesia team available at all times to provide care throughout the hospital, including epidural management and anesthesia for C-section. I don't know your plan for the delivery whether it a natural birth, water birth, IV sedation, nitrous supplementation, doula or epidural, but we want to meet your pain goals."   1.Was your pain managed to your expectations on prior hospitalizations?   Yes   2.What is your expectation for pain management during this hospitalization?     Labor support without medications, Epidural, IV pain meds and Nitrous Oxide  3.How can we help you reach that goal? Be available, not planning epidural  Record the patient's initial score and the patient's pain goal.   Pain: 2  Pain Goal: 7 [facility] wants you to be able to say your pain was always managed very well.  [facility] 04/26/2018

## 2018-04-27 LAB — CBC
HEMATOCRIT: 31.4 % — AB (ref 36.0–46.0)
HEMOGLOBIN: 10.8 g/dL — AB (ref 12.0–15.0)
MCH: 30.9 pg (ref 26.0–34.0)
MCHC: 34.4 g/dL (ref 30.0–36.0)
MCV: 89.7 fL (ref 80.0–100.0)
Platelets: 164 10*3/uL (ref 150–400)
RBC: 3.5 MIL/uL — ABNORMAL LOW (ref 3.87–5.11)
RDW: 14 % (ref 11.5–15.5)
WBC: 12.4 10*3/uL — ABNORMAL HIGH (ref 4.0–10.5)
nRBC: 0 % (ref 0.0–0.2)

## 2018-04-27 MED ORDER — IBUPROFEN 600 MG PO TABS: 600.0000 mg | ORAL_TABLET | Freq: Four times a day (QID) | ORAL | 0 refills | Status: AC

## 2018-04-27 NOTE — Lactation Note (Signed)
This note was copied from a baby's chart. Lactation Consultation Note  Patient Name: Wendy Mccullough [id number] Date: 04/27/2018 Reason for consult: Initial assessment;Term P3, 10 hour female infant. Mom has BF 5 times since infant's birth. LC entered room, mom asleep in bed holding infant. LC asked mom if she can place infant in basinet. Per mom, Yes! Per mom, she just BF infant 10 minutes before LC entered room for 15 minutes. Per mom, she feels BF is going well and she previously BF other children for 11 and 12 months. LC did not observe latch at this time. Mom will BF according hunger cues, 8 to 12 times within 24 hours including nights. LC discussed  I & O. Reviewed Baby & Me book's Breastfeeding Basics.  Mom made aware of O/P services, breastfeeding support groups, community resources, and our phone # for post-discharge questions.  Maternal Data Formula Feeding for Exclusion: No  Feeding Feeding Type: Breast Fed  LATCH Score                   Interventions Interventions: Breast feeding basics reviewed  Lactation Tools Discussed/Used WIC Program: No   Consult Status      Wendy Mccullough 04/27/2018, 1:25 AM

## 2018-04-27 NOTE — Discharge Summary (Signed)
Obstetric Discharge Summary Reason for Admission: induction of labor Prenatal Procedures: none Intrapartum Procedures: spontaneous vaginal delivery Postpartum Procedures: none Complications-Operative and Postpartum: none Hemoglobin  Date Value Ref Range Status  04/27/2018 10.8 (L) 12.0 - 15.0 g/dL Final   HCT  Date Value Ref Range Status  04/27/2018 31.4 (L) 36.0 - 46.0 % Final    Physical Exam:  General: alert Lochia: appropriate Uterine Fundus: firm Incision: healing well DVT Evaluation: No evidence of DVT seen on physical exam.  Discharge Diagnoses: Term Pregnancy-delivered  Discharge Information: Date: 04/27/2018 Activity: pelvic rest Diet: routine Medications: PNV and Ibuprofen Condition: stable Instructions: refer to practice specific booklet Discharge to: home Follow-up Information    [place] Schedule an appointment as soon as possible for a visit in 6 week(s).   Contact information: [address] [place] [address] [phone]           Newborn Data: Live born female  Birth Weight: 8 lb 6.2 oz (3805 g) APGAR: 8, 9  Newborn Delivery   Birth date/time:  04/26/2018 14:50:00 Delivery type:  Vaginal, Spontaneous     Home with mother.  Wendy Mccullough Wendy Mccullough 04/27/2018, 8:10 AM

## 2019-06-28 NOTE — L&D Delivery Note (Signed)
Delivery Note At 12:16 PM a viable female was delivered via  OP Presentation APGAR: 9 9  weight  pending.   Placenta status: spontaneously with 3 vessel cord  ,  .  Cord:   with the following complications: none .  Cord pH: not obtained  Anesthesia:  local Episiotomy:  none Lacerations: first  Suture Repair: 3.0 chromic Est. Blood Loss (mL): 300   Mom to postpartum.  Baby to Couplet care / Skin to Skin.  Jeani Hawking 02/05/2020, 12:27 PM

## 2019-07-05 LAB — OB RESULTS CONSOLE HEPATITIS B SURFACE ANTIGEN: Hepatitis B Surface Ag: NEGATIVE

## 2019-07-05 LAB — OB RESULTS CONSOLE GC/CHLAMYDIA
Chlamydia: NEGATIVE
Gonorrhea: NEGATIVE

## 2019-07-05 LAB — OB RESULTS CONSOLE ABO/RH: RH Type: POSITIVE

## 2019-07-05 LAB — OB RESULTS CONSOLE RPR: RPR: NONREACTIVE

## 2019-07-05 LAB — OB RESULTS CONSOLE RUBELLA ANTIBODY, IGM: Rubella: IMMUNE

## 2019-07-05 LAB — OB RESULTS CONSOLE ANTIBODY SCREEN: Antibody Screen: NEGATIVE

## 2019-07-05 LAB — OB RESULTS CONSOLE HIV ANTIBODY (ROUTINE TESTING): HIV: NONREACTIVE

## 2019-09-19 ENCOUNTER — Ambulatory Visit: Payer: Commercial Managed Care - PPO | Attending: Internal Medicine

## 2019-09-19 DIAGNOSIS — Z23 Encounter for immunization: Secondary | ICD-10-CM

## 2019-09-19 NOTE — Progress Notes (Signed)
   Covid-19 Vaccination Clinic  Name:  Genisis Sonnier    MRN: [id number] DOB: 04/13/1991  09/19/2019  Ms. Knoop was observed post Covid-19 immunization for 15 minutes without incident. She was provided with Vaccine Information Sheet and instruction to access the V-Safe system.   Ms. Branscum was instructed to call 911 with any severe reactions post vaccine: Marland Kitchen Difficulty breathing  . Swelling of face and throat  . A fast heartbeat  . A bad rash all over body  . Dizziness and weakness   Immunizations Administered    Name Date Dose VIS Date Route   Pfizer COVID-19 Vaccine 09/19/2019  5:03 PM 0.3 mL 06/07/2019 Intramuscular   Manufacturer: [employer], [employer]   Lot: [id number]   NDC: [address]-0

## 2019-09-24 ENCOUNTER — Telehealth: Payer: Self-pay

## 2019-09-25 NOTE — Telephone Encounter (Signed)
error 

## 2019-10-14 ENCOUNTER — Ambulatory Visit: Payer: Commercial Managed Care - PPO | Attending: Internal Medicine

## 2019-10-14 DIAGNOSIS — Z23 Encounter for immunization: Secondary | ICD-10-CM

## 2019-10-14 NOTE — Progress Notes (Signed)
   Covid-19 Vaccination Clinic  Name:  Wendy Mccullough    MRN: [id number] DOB: [birth year]  10/14/2019  Ms. Wendy Mccullough was observed post Covid-19 immunization for 15 minutes without incident. She was provided with Vaccine Information Sheet and instruction to access the V-Safe system.   Wendy Mccullough was instructed to call 911 with any severe reactions post vaccine: Marland Kitchen Difficulty breathing  . Swelling of face and throat  . A fast heartbeat  . A bad rash all over body  . Dizziness and weakness   Immunizations Administered    Name Date Dose VIS Date Route   Pfizer COVID-19 Vaccine 10/14/2019  4:41 PM 0.3 mL 08/21/2018 Intramuscular   Manufacturer: [employer], [employer]   Lot: [id number]   NDC: [address]-1

## 2020-01-13 LAB — OB RESULTS CONSOLE GBS: GBS: POSITIVE

## 2020-01-22 ENCOUNTER — Telehealth (HOSPITAL_COMMUNITY): Payer: Self-pay | Admitting: *Deleted

## 2020-01-22 ENCOUNTER — Encounter (HOSPITAL_COMMUNITY): Payer: Self-pay | Admitting: *Deleted

## 2020-01-22 NOTE — Telephone Encounter (Signed)
Preadmission screen  

## 2020-01-23 ENCOUNTER — Encounter (HOSPITAL_COMMUNITY): Payer: Self-pay | Admitting: *Deleted

## 2020-02-03 ENCOUNTER — Other Ambulatory Visit (HOSPITAL_COMMUNITY)
Admission: RE | Admit: 2020-02-03 | Discharge: 2020-02-03 | Disposition: A | Discharge status: Home / Self Care | Payer: Commercial Managed Care - PPO | Source: Ambulatory Visit | Attending: Obstetrics and Gynecology | Admitting: Obstetrics and Gynecology

## 2020-02-03 DIAGNOSIS — Z01812 Encounter for preprocedural laboratory examination: Secondary | ICD-10-CM | POA: Insufficient documentation

## 2020-02-03 DIAGNOSIS — Z20822 Contact with and (suspected) exposure to covid-19: Secondary | ICD-10-CM | POA: Insufficient documentation

## 2020-02-03 LAB — SARS CORONAVIRUS 2 (TAT 6-24 HRS): SARS Coronavirus 2: NEGATIVE

## 2020-02-05 ENCOUNTER — Inpatient Hospital Stay (HOSPITAL_COMMUNITY)
Admission: AD | Admit: 2020-02-05 | Discharge: 2020-02-06 | DRG: 807 | Disposition: A | Discharge status: Home / Self Care | Payer: Commercial Managed Care - PPO | Attending: Obstetrics and Gynecology | Admitting: Obstetrics and Gynecology

## 2020-02-05 ENCOUNTER — Other Ambulatory Visit: Payer: Self-pay

## 2020-02-05 ENCOUNTER — Encounter (HOSPITAL_COMMUNITY): Payer: Self-pay | Admitting: Obstetrics & Gynecology

## 2020-02-05 ENCOUNTER — Inpatient Hospital Stay (HOSPITAL_COMMUNITY): Payer: Commercial Managed Care - PPO

## 2020-02-05 DIAGNOSIS — Z20822 Contact with and (suspected) exposure to covid-19: Secondary | ICD-10-CM | POA: Diagnosis present

## 2020-02-05 DIAGNOSIS — O26893 Other specified pregnancy related conditions, third trimester: Secondary | ICD-10-CM | POA: Diagnosis present

## 2020-02-05 DIAGNOSIS — Z3A39 39 weeks gestation of pregnancy: Secondary | ICD-10-CM

## 2020-02-05 DIAGNOSIS — O99824 Streptococcus B carrier state complicating childbirth: Principal | ICD-10-CM | POA: Diagnosis present

## 2020-02-05 DIAGNOSIS — Z3490 Encounter for supervision of normal pregnancy, unspecified, unspecified trimester: Secondary | ICD-10-CM

## 2020-02-05 LAB — CBC
HCT: 34.1 % — ABNORMAL LOW (ref 36.0–46.0)
Hemoglobin: 12 g/dL (ref 12.0–15.0)
MCH: 32.1 pg (ref 26.0–34.0)
MCHC: 35.2 g/dL (ref 30.0–36.0)
MCV: 91.2 fL (ref 80.0–100.0)
Platelets: 193 10*3/uL (ref 150–400)
RBC: 3.74 MIL/uL — ABNORMAL LOW (ref 3.87–5.11)
RDW: 14.1 % (ref 11.5–15.5)
WBC: 8.8 10*3/uL (ref 4.0–10.5)
nRBC: 0 % (ref 0.0–0.2)

## 2020-02-05 LAB — TYPE AND SCREEN
ABO/RH(D): O POS
Antibody Screen: NEGATIVE

## 2020-02-05 LAB — RPR: RPR Ser Ql: NONREACTIVE

## 2020-02-05 MED ORDER — TETANUS-DIPHTH-ACELL PERTUSSIS 5-2.5-18.5 LF-MCG/0.5 IM SUSP: 0.5000 mL | Freq: Once | INTRAMUSCULAR | Status: DC

## 2020-02-05 MED ORDER — ZOLPIDEM TARTRATE 5 MG PO TABS: 5.0000 mg | ORAL_TABLET | Freq: Every evening | ORAL | Status: DC | PRN

## 2020-02-05 MED ORDER — IBUPROFEN 600 MG PO TABS
600.0000 mg | ORAL_TABLET | Freq: Four times a day (QID) | ORAL | Status: DC
  Administered 2020-02-05 – 2020-02-06 (×5): 600 mg via ORAL
  Filled 2020-02-05 (×6): qty 1

## 2020-02-05 MED ORDER — OXYCODONE-ACETAMINOPHEN 5-325 MG PO TABS: 2.0000 | ORAL_TABLET | ORAL | Status: DC | PRN

## 2020-02-05 MED ORDER — ONDANSETRON HCL 4 MG/2ML IJ SOLN: 4.0000 mg | Freq: Four times a day (QID) | INTRAMUSCULAR | Status: DC | PRN

## 2020-02-05 MED ORDER — TERBUTALINE SULFATE 1 MG/ML IJ SOLN: 0.2500 mg | Freq: Once | INTRAMUSCULAR | Status: DC | PRN

## 2020-02-05 MED ORDER — BISACODYL 10 MG RE SUPP: 10.0000 mg | Freq: Every day | RECTAL | Status: DC | PRN

## 2020-02-05 MED ORDER — LACTATED RINGERS IV SOLN: INTRAVENOUS | Status: DC

## 2020-02-05 MED ORDER — BENZOCAINE-MENTHOL 20-0.5 % EX AERO
1.0000 "application " | INHALATION_SPRAY | CUTANEOUS | Status: DC | PRN
  Administered 2020-02-05: 1 via TOPICAL
  Filled 2020-02-05: qty 56

## 2020-02-05 MED ORDER — ACETAMINOPHEN 325 MG PO TABS: 650.0000 mg | ORAL_TABLET | ORAL | Status: DC | PRN

## 2020-02-05 MED ORDER — ONDANSETRON HCL 4 MG/2ML IJ SOLN: 4.0000 mg | INTRAMUSCULAR | Status: DC | PRN

## 2020-02-05 MED ORDER — FLEET ENEMA 7-19 GM/118ML RE ENEM: 1.0000 | ENEMA | Freq: Every day | RECTAL | Status: DC | PRN

## 2020-02-05 MED ORDER — ACETAMINOPHEN 325 MG PO TABS
650.0000 mg | ORAL_TABLET | ORAL | Status: DC | PRN
  Administered 2020-02-05: 650 mg via ORAL
  Filled 2020-02-05 (×2): qty 2

## 2020-02-05 MED ORDER — PRENATAL MULTIVITAMIN CH
1.0000 | ORAL_TABLET | Freq: Every day | ORAL | Status: DC
  Administered 2020-02-06: 1 via ORAL
  Filled 2020-02-05: qty 1

## 2020-02-05 MED ORDER — DIBUCAINE (PERIANAL) 1 % EX OINT: 1.0000 "application " | TOPICAL_OINTMENT | CUTANEOUS | Status: DC | PRN

## 2020-02-05 MED ORDER — MEASLES, MUMPS & RUBELLA VAC IJ SOLR: 0.5000 mL | Freq: Once | INTRAMUSCULAR | Status: DC

## 2020-02-05 MED ORDER — ONDANSETRON HCL 4 MG PO TABS: 4.0000 mg | ORAL_TABLET | ORAL | Status: DC | PRN

## 2020-02-05 MED ORDER — OXYTOCIN-SODIUM CHLORIDE 30-0.9 UT/500ML-% IV SOLN
2.5000 [IU]/h | INTRAVENOUS | Status: DC
  Filled 2020-02-05: qty 500

## 2020-02-05 MED ORDER — SOD CITRATE-CITRIC ACID 500-334 MG/5ML PO SOLN: 30.0000 mL | ORAL | Status: DC | PRN

## 2020-02-05 MED ORDER — LIDOCAINE HCL (PF) 1 % IJ SOLN
30.0000 mL | INTRAMUSCULAR | Status: AC | PRN
  Administered 2020-02-05: 30 mL via SUBCUTANEOUS

## 2020-02-05 MED ORDER — SODIUM CHLORIDE 0.9 % IV SOLN
5.0000 10*6.[IU] | Freq: Once | INTRAVENOUS | Status: AC
  Administered 2020-02-05: 5 10*6.[IU] via INTRAVENOUS
  Filled 2020-02-05: qty 5

## 2020-02-05 MED ORDER — WITCH HAZEL-GLYCERIN EX PADS: 1.0000 "application " | MEDICATED_PAD | CUTANEOUS | Status: DC | PRN

## 2020-02-05 MED ORDER — LIDOCAINE HCL (PF) 1 % IJ SOLN
INTRAMUSCULAR | Status: AC
  Filled 2020-02-05: qty 30

## 2020-02-05 MED ORDER — PENICILLIN G POT IN DEXTROSE 60000 UNIT/ML IV SOLN
3.0000 10*6.[IU] | INTRAVENOUS | Status: DC
  Administered 2020-02-05: 3 10*6.[IU] via INTRAVENOUS
  Filled 2020-02-05: qty 50

## 2020-02-05 MED ORDER — LACTATED RINGERS IV SOLN: 500.0000 mL | INTRAVENOUS | Status: DC | PRN

## 2020-02-05 MED ORDER — MEDROXYPROGESTERONE ACETATE 150 MG/ML IM SUSP: 150.0000 mg | INTRAMUSCULAR | Status: DC | PRN

## 2020-02-05 MED ORDER — MISOPROSTOL 25 MCG QUARTER TABLET
25.0000 ug | ORAL_TABLET | ORAL | Status: DC | PRN
  Administered 2020-02-05: 25 ug via VAGINAL
  Filled 2020-02-05: qty 1

## 2020-02-05 MED ORDER — COCONUT OIL OIL: 1.0000 "application " | TOPICAL_OIL | Status: DC | PRN

## 2020-02-05 MED ORDER — OXYTOCIN BOLUS FROM INFUSION: 333.0000 mL | Freq: Once | INTRAVENOUS | Status: DC

## 2020-02-05 MED ORDER — SENNOSIDES-DOCUSATE SODIUM 8.6-50 MG PO TABS
2.0000 | ORAL_TABLET | ORAL | Status: DC
  Administered 2020-02-06: 2 via ORAL
  Filled 2020-02-05: qty 2

## 2020-02-05 MED ORDER — BUTORPHANOL TARTRATE 1 MG/ML IJ SOLN
INTRAMUSCULAR | Status: AC
  Filled 2020-02-05: qty 1

## 2020-02-05 MED ORDER — LEVOTHYROXINE SODIUM 112 MCG PO TABS
112.0000 ug | ORAL_TABLET | Freq: Every day | ORAL | Status: DC
  Administered 2020-02-06: 112 ug via ORAL
  Filled 2020-02-05 (×2): qty 1

## 2020-02-05 MED ORDER — BUTORPHANOL TARTRATE 1 MG/ML IJ SOLN
INTRAMUSCULAR | Status: AC
  Filled 2020-02-05: qty 2

## 2020-02-05 MED ORDER — OXYCODONE-ACETAMINOPHEN 5-325 MG PO TABS: 1.0000 | ORAL_TABLET | ORAL | Status: DC | PRN

## 2020-02-05 MED ORDER — BUTORPHANOL TARTRATE 1 MG/ML IJ SOLN
2.0000 mg | Freq: Once | INTRAMUSCULAR | Status: AC
  Administered 2020-02-05: 2 mg via INTRAVENOUS

## 2020-02-05 MED ORDER — SIMETHICONE 80 MG PO CHEW: 80.0000 mg | CHEWABLE_TABLET | ORAL | Status: DC | PRN

## 2020-02-05 MED ORDER — DIPHENHYDRAMINE HCL 25 MG PO CAPS: 25.0000 mg | ORAL_CAPSULE | Freq: Four times a day (QID) | ORAL | Status: DC | PRN

## 2020-02-05 NOTE — H&P (Signed)
Wendy Mccullough is a 29 y.o. G 5 P 3 at 28 w 1 day presents for IOL at term Status post cytotec at 0230 OB History    Gravida  5   Para  3   Term  3   Preterm      AB  1   Living  3     SAB  1   TAB      Ectopic      Multiple  0   Live Births  3          Past Medical History:  Diagnosis Date  . Hypothyroidism   . PONV (postoperative nausea and vomiting)   . Vaginal Pap smear, abnormal    Past Surgical History:  Procedure Laterality Date  . CHOLECYSTECTOMY  09/1016  . DILATION AND EVACUATION N/A 06/26/2017   Procedure: DILATATION AND EVACUATION;  Surgeon: Ranae Pila, MD;  Location: [facility];  Service: Gynecology;  Laterality: N/A;  . NASAL SEPTUM SURGERY    . TONSILLECTOMY  2007   Family History: family history includes Diabetes in her maternal grandfather and maternal grandmother; Heart disease in her maternal grandmother; Hypertension in her mother; Pancreatic cancer in her paternal grandfather. Social History:  reports that she has never smoked. She has never used smokeless tobacco. She reports that she does not drink alcohol and does not use drugs.     Maternal Diabetes: No Genetic Screening: Normal Maternal Ultrasounds/Referrals: Normal Fetal Ultrasounds or other Referrals:  None Maternal Substance Abuse:  No Significant Maternal Medications:  None Significant Maternal Lab Results:  None Other Comments:  None  Review of Systems  All other systems reviewed and are negative.  History Dilation: 3 Effacement (%): 80 Station: -2 Exam by:: Dr Vincente Poli  Blood pressure 109/73, pulse 75, temperature 98.1 F (36.7 C), temperature source Oral, resp. rate 16, height 5\' 7"  (1.702 m), weight 83.6 kg, unknown if currently breastfeeding. Maternal Exam:  Abdomen: Fetal presentation: vertex     Fetal Exam Fetal State Assessment: Category I - tracings are normal.     Physical Exam Vitals and nursing note reviewed. Exam conducted with a  chaperone present.  Constitutional:      Appearance: Normal appearance.  Eyes:     Pupils: Pupils are equal, round, and reactive to light.  Cardiovascular:     Rate and Rhythm: Normal rate and regular rhythm.  Musculoskeletal:     Cervical back: Normal range of motion.  Neurological:     Mental Status: She is alert.     Prenatal labs: ABO, Rh: --/--/O POS (08/11 0235) Antibody: NEG (08/11 0235) Rubella: Immune (01/08 0000) RPR: Nonreactive (01/08 0000)  HBsAg: Negative (01/08 0000)  HIV: Non-reactive (01/08 0000)  GBS: Positive/-- (07/19 0000)   Assessment/Plan: IUP at term IOL Pitocin prn   06-27-1991 02/05/2020, 7:29 AM

## 2020-02-06 LAB — CBC
HCT: 29.2 % — ABNORMAL LOW (ref 36.0–46.0)
Hemoglobin: 10 g/dL — ABNORMAL LOW (ref 12.0–15.0)
MCH: 30.8 pg (ref 26.0–34.0)
MCHC: 34.2 g/dL (ref 30.0–36.0)
MCV: 89.8 fL (ref 80.0–100.0)
Platelets: 164 10*3/uL (ref 150–400)
RBC: 3.25 MIL/uL — ABNORMAL LOW (ref 3.87–5.11)
RDW: 13.9 % (ref 11.5–15.5)
WBC: 11.5 10*3/uL — ABNORMAL HIGH (ref 4.0–10.5)
nRBC: 0 % (ref 0.0–0.2)

## 2020-02-06 MED ORDER — ACETAMINOPHEN 325 MG PO TABS: 650.0000 mg | ORAL_TABLET | Freq: Four times a day (QID) | ORAL | 0 refills | Status: AC | PRN

## 2020-02-06 NOTE — Progress Notes (Signed)
AVS printed and discharged instructions given to pt. Pt instructed to call for follow-up appointment and to pick up prescriptions. All questions answered and pt verbalized understanding.  

## 2020-02-06 NOTE — Discharge Summary (Signed)
Postpartum Discharge Summary  Date of Service updated 02/06/20     Patient Name: Wendy Mccullough DOB: May 24, 1991 MRN: [id number]  Date of admission: 02/05/2020 Delivery date:02/05/2020  Delivering provider: Dian Queen  Date of discharge: 02/06/2020  Admitting diagnosis: Pregnant [Z34.90] NSVD (normal spontaneous vaginal delivery) [O80] Intrauterine pregnancy: [redacted]w[redacted]d    Secondary diagnosis:  Active Problems:   NSVD (normal spontaneous vaginal delivery)   Pregnant  Additional problems:     Discharge diagnosis: Term Pregnancy Delivered                                              Post partum procedures: Augmentation: Pitocin Complications: None  Hospital course: Induction of Labor With Vaginal Delivery   29y.o. yo [id number] admitted to the hospital 02/05/2020 for induction of labor.  Indication for induction: Elective.  Patient had an uncomplicated labor course as follows: Membrane Rupture Time/Date: 7:26 AM ,02/05/2020   Delivery Method:Vaginal, Spontaneous  Episiotomy: None  Lacerations:  1st degree  Details of delivery can be found in separate delivery note.  Patient had a routine postpartum course. Patient is discharged home 02/06/20.  Newborn Data: Birth date:02/05/2020  Birth time:12:16 PM  Gender:Female  Living status:Living  Apgars:9 ,10  Weight:3700 g   Magnesium Sulfate received: No BMZ received: No Rhophylac:No MMR:No T-DaP:Given prenatally Flu: No Transfusion:No  Physical exam  Vitals:   02/05/20 1807 02/05/20 2238 02/06/20 0231 02/06/20 0556  BP: 108/74 113/60 105/67 95/63  Pulse: 70 72 68 70  Resp: _0 Temp: 98.2 F (36.8 C) 98.4 F (36.9 C) 97.6 F (36.4 C) 97.8 F (36.6 C)  TempSrc: Oral Oral Oral Oral  SpO2:  100% 100% 100%  Weight:      Height:       General: alert, cooperative and no distress Lochia: appropriate Uterine Fundus: firm Incision: Healing well with no significant drainage DVT Evaluation: No  evidence of DVT seen on physical exam. Labs: Lab Results  Component Value Date   WBC 11.5 (H) 02/06/2020   HGB 10.0 (L) 02/06/2020   HCT 29.2 (L) 02/06/2020   MCV 89.8 02/06/2020   PLT 164 02/06/2020   No flowsheet data found. Edinburgh Score: Edinburgh Postnatal Depression Scale Screening Tool 02/05/2020  I have been able to laugh and see the funny side of things. 0  I have looked forward with enjoyment to things. 0  I have blamed myself unnecessarily when things went wrong. 0  I have been anxious or worried for no good reason. 0  I have felt scared or panicky for no good reason. 0  Things have been getting on top of me. 0  I have been so unhappy that I have had difficulty sleeping. 0  I have felt sad or miserable. 0  I have been so unhappy that I have been crying. 0  The thought of harming myself has occurred to me. 0  Edinburgh Postnatal Depression Scale Total 0      After visit meds:  Allergies as of 02/06/2020      Reactions   Morphine Nausea And Vomiting   migraine      Medication List    TAKE these medications   acetaminophen 325 MG tablet Commonly known as: Tylenol Take 2 tablets (650 mg total) by mouth every 6 (six) hours as needed (for  pain scale < 4).   ibuprofen 600 MG tablet Commonly known as: ADVIL Take 1 tablet (600 mg total) by mouth every 6 (six) hours.   prenatal multivitamin Tabs tablet Take 2 tablets by mouth daily. Morning, afternoon, night   Synthroid 112 MCG tablet Generic drug: levothyroxine Take 112 mcg by mouth daily before breakfast.        Discharge home in stable condition Infant Feeding: Breast Infant Disposition:home with mother Discharge instruction: per After Visit Summary and Postpartum booklet. Activity: Advance as tolerated. Pelvic rest for 6 weeks.  Diet: routine diet Anticipated Birth Control: Unsure Postpartum Appointment:6 weeks Additional Postpartum F/U:  Future Appointments:No future appointments. Follow up  Visit:      02/06/2020 Allena Katz, MD

## 2022-11-28 ENCOUNTER — Other Ambulatory Visit (HOSPITAL_BASED_OUTPATIENT_CLINIC_OR_DEPARTMENT_OTHER): Payer: Self-pay

## 2022-11-28 MED ORDER — WEGOVY 0.25 MG/0.5ML ~~LOC~~ SOAJ
0.2500 mg | SUBCUTANEOUS | 0 refills | Status: AC
  Filled 2022-11-28: qty 2, 28d supply, fill #0

## 2022-12-04 ENCOUNTER — Other Ambulatory Visit (HOSPITAL_BASED_OUTPATIENT_CLINIC_OR_DEPARTMENT_OTHER): Payer: Self-pay

## 2023-07-31 ENCOUNTER — Other Ambulatory Visit (HOSPITAL_BASED_OUTPATIENT_CLINIC_OR_DEPARTMENT_OTHER): Payer: Self-pay

## 2023-07-31 MED ORDER — ZEPBOUND 2.5 MG/0.5ML ~~LOC~~ SOAJ
2.5000 mg | SUBCUTANEOUS | 0 refills | Status: DC
  Filled 2023-07-31: qty 2, 28d supply, fill #0

## 2023-08-04 ENCOUNTER — Other Ambulatory Visit (HOSPITAL_BASED_OUTPATIENT_CLINIC_OR_DEPARTMENT_OTHER): Payer: Self-pay

## 2023-08-09 ENCOUNTER — Other Ambulatory Visit (HOSPITAL_BASED_OUTPATIENT_CLINIC_OR_DEPARTMENT_OTHER): Payer: Self-pay

## 2023-08-28 ENCOUNTER — Other Ambulatory Visit (HOSPITAL_BASED_OUTPATIENT_CLINIC_OR_DEPARTMENT_OTHER): Payer: Self-pay

## 2023-08-28 MED ORDER — ZEPBOUND 5 MG/0.5ML ~~LOC~~ SOAJ
5.0000 mg | SUBCUTANEOUS | 0 refills | Status: AC
  Filled 2023-08-28: qty 2, 28d supply, fill #0

## 2023-09-21 ENCOUNTER — Other Ambulatory Visit: Payer: Self-pay

## 2023-09-21 ENCOUNTER — Other Ambulatory Visit (HOSPITAL_BASED_OUTPATIENT_CLINIC_OR_DEPARTMENT_OTHER): Payer: Self-pay

## 2023-09-21 MED ORDER — PROMETHAZINE HCL 25 MG PO TABS
25.0000 mg | ORAL_TABLET | Freq: Four times a day (QID) | ORAL | 0 refills | Status: AC | PRN
  Filled 2023-09-21: qty 20, 5d supply, fill #0

## 2023-09-21 MED ORDER — ZEPBOUND 7.5 MG/0.5ML ~~LOC~~ SOAJ
7.5000 mg | SUBCUTANEOUS | 0 refills | Status: AC
  Filled 2023-09-21 – 2023-09-26 (×2): qty 2, 28d supply, fill #0

## 2023-09-26 ENCOUNTER — Other Ambulatory Visit: Payer: Self-pay

## 2023-09-26 ENCOUNTER — Other Ambulatory Visit (HOSPITAL_BASED_OUTPATIENT_CLINIC_OR_DEPARTMENT_OTHER): Payer: Self-pay

## 2023-10-18 ENCOUNTER — Other Ambulatory Visit (HOSPITAL_BASED_OUTPATIENT_CLINIC_OR_DEPARTMENT_OTHER): Payer: Self-pay

## 2023-10-18 MED ORDER — ZEPBOUND 10 MG/0.5ML ~~LOC~~ SOAJ
10.0000 mg | SUBCUTANEOUS | 0 refills | Status: DC
  Filled 2023-10-18: qty 2, 28d supply, fill #0

## 2023-11-13 ENCOUNTER — Other Ambulatory Visit (HOSPITAL_BASED_OUTPATIENT_CLINIC_OR_DEPARTMENT_OTHER): Payer: Self-pay

## 2023-11-13 ENCOUNTER — Other Ambulatory Visit: Payer: Self-pay

## 2023-11-13 MED ORDER — ZEPBOUND 12.5 MG/0.5ML ~~LOC~~ SOAJ
12.5000 mg | SUBCUTANEOUS | 0 refills | Status: AC
  Filled 2023-11-13 – 2024-02-12 (×2): qty 2, 28d supply, fill #0

## 2023-11-13 MED ORDER — ZEPBOUND 12.5 MG/0.5ML ~~LOC~~ SOAJ
12.5000 mg | SUBCUTANEOUS | 0 refills | Status: AC
  Filled 2024-02-12: qty 2, 28d supply, fill #0

## 2023-12-05 ENCOUNTER — Other Ambulatory Visit (HOSPITAL_BASED_OUTPATIENT_CLINIC_OR_DEPARTMENT_OTHER): Payer: Self-pay

## 2023-12-05 MED ORDER — ZEPBOUND 12.5 MG/0.5ML ~~LOC~~ SOAJ
12.5000 mg | SUBCUTANEOUS | 0 refills | Status: DC
  Filled 2023-12-05: qty 2, 28d supply, fill #0

## 2023-12-20 ENCOUNTER — Other Ambulatory Visit (HOSPITAL_BASED_OUTPATIENT_CLINIC_OR_DEPARTMENT_OTHER): Payer: Self-pay

## 2023-12-20 MED ORDER — ZEPBOUND 15 MG/0.5ML ~~LOC~~ SOAJ
15.0000 mg | SUBCUTANEOUS | 0 refills | Status: AC
  Filled 2023-12-20 – 2024-02-12 (×2): qty 2, 28d supply, fill #0

## 2024-01-08 ENCOUNTER — Other Ambulatory Visit: Payer: Self-pay

## 2024-01-08 ENCOUNTER — Other Ambulatory Visit (HOSPITAL_BASED_OUTPATIENT_CLINIC_OR_DEPARTMENT_OTHER): Payer: Self-pay

## 2024-01-08 MED ORDER — LANSOPRAZOLE 30 MG PO CPDR
30.0000 mg | DELAYED_RELEASE_CAPSULE | Freq: Every day | ORAL | 1 refills | Status: AC
  Filled 2024-01-08 – 2024-02-12 (×2): qty 30, 30d supply, fill #0

## 2024-01-08 MED ORDER — BUPROPION HCL ER (XL) 300 MG PO TB24
300.0000 mg | ORAL_TABLET | Freq: Every morning | ORAL | 0 refills | Status: AC
  Filled 2024-01-08 – 2024-02-12 (×2): qty 30, 30d supply, fill #0

## 2024-01-08 MED ORDER — SERTRALINE HCL 50 MG PO TABS
150.0000 mg | ORAL_TABLET | Freq: Every day | ORAL | 0 refills | Status: AC
  Filled 2024-01-08 – 2024-02-12 (×2): qty 90, 30d supply, fill #0

## 2024-01-17 ENCOUNTER — Other Ambulatory Visit (HOSPITAL_BASED_OUTPATIENT_CLINIC_OR_DEPARTMENT_OTHER): Payer: Self-pay

## 2024-01-19 ENCOUNTER — Other Ambulatory Visit (HOSPITAL_BASED_OUTPATIENT_CLINIC_OR_DEPARTMENT_OTHER): Payer: Self-pay

## 2024-01-19 MED ORDER — ZEPBOUND 15 MG/0.5ML ~~LOC~~ SOAJ
15.0000 mg | SUBCUTANEOUS | 0 refills | Status: AC
  Filled 2024-01-19 – 2024-02-12 (×2): qty 6, 84d supply, fill #0
  Filled 2024-04-30: qty 2, 28d supply, fill #0

## 2024-01-24 ENCOUNTER — Encounter (HOSPITAL_BASED_OUTPATIENT_CLINIC_OR_DEPARTMENT_OTHER): Payer: Self-pay

## 2024-01-24 ENCOUNTER — Other Ambulatory Visit (HOSPITAL_BASED_OUTPATIENT_CLINIC_OR_DEPARTMENT_OTHER): Payer: Self-pay

## 2024-01-24 MED ORDER — LANSOPRAZOLE 30 MG PO CPDR
30.0000 mg | DELAYED_RELEASE_CAPSULE | Freq: Every day | ORAL | 1 refills | Status: AC
  Filled 2024-01-24 – 2024-02-12 (×2): qty 30, 30d supply, fill #0

## 2024-02-12 ENCOUNTER — Other Ambulatory Visit (HOSPITAL_BASED_OUTPATIENT_CLINIC_OR_DEPARTMENT_OTHER): Payer: Self-pay

## 2024-02-12 ENCOUNTER — Other Ambulatory Visit (HOSPITAL_COMMUNITY): Payer: Self-pay

## 2024-02-12 ENCOUNTER — Other Ambulatory Visit: Payer: Self-pay

## 2024-02-12 MED ORDER — ZEPBOUND 15 MG/0.5ML ~~LOC~~ SOAJ
15.0000 mg | SUBCUTANEOUS | 0 refills | Status: AC
  Filled 2024-02-12 (×2): qty 2, 28d supply, fill #0

## 2024-02-12 MED ORDER — SYNTHROID 175 MCG PO TABS
175.0000 ug | ORAL_TABLET | Freq: Every day | ORAL | 1 refills | Status: AC
  Filled 2024-02-12 (×2): qty 30, 30d supply, fill #0

## 2024-02-12 MED ORDER — BUPROPION HCL ER (XL) 300 MG PO TB24
300.0000 mg | ORAL_TABLET | Freq: Every morning | ORAL | 0 refills | Status: AC
  Filled 2024-02-12 (×2): qty 30, 30d supply, fill #0

## 2024-02-12 MED ORDER — SERTRALINE HCL 50 MG PO TABS
150.0000 mg | ORAL_TABLET | Freq: Every day | ORAL | 0 refills | Status: AC
  Filled 2024-02-12 (×2): qty 90, 30d supply, fill #0

## 2024-02-29 ENCOUNTER — Other Ambulatory Visit (HOSPITAL_COMMUNITY): Payer: Self-pay

## 2024-02-29 MED ORDER — ZEPBOUND 15 MG/0.5ML ~~LOC~~ SOAJ
15.0000 mg | SUBCUTANEOUS | 0 refills | Status: AC
  Filled 2024-02-29 – 2024-03-04 (×2): qty 2, 28d supply, fill #0

## 2024-03-04 ENCOUNTER — Encounter (HOSPITAL_COMMUNITY): Payer: Self-pay

## 2024-03-04 ENCOUNTER — Other Ambulatory Visit (HOSPITAL_COMMUNITY): Payer: Self-pay

## 2024-03-06 ENCOUNTER — Other Ambulatory Visit (HOSPITAL_COMMUNITY): Payer: Self-pay

## 2024-03-12 ENCOUNTER — Other Ambulatory Visit (HOSPITAL_BASED_OUTPATIENT_CLINIC_OR_DEPARTMENT_OTHER): Payer: Self-pay

## 2024-03-12 MED ORDER — SERTRALINE HCL 50 MG PO TABS
150.0000 mg | ORAL_TABLET | Freq: Every day | ORAL | 0 refills | Status: AC
  Filled 2024-03-12: qty 90, 30d supply, fill #0

## 2024-03-12 MED ORDER — BUPROPION HCL ER (XL) 300 MG PO TB24
300.0000 mg | ORAL_TABLET | Freq: Every morning | ORAL | 0 refills | Status: AC
  Filled 2024-03-12: qty 30, 30d supply, fill #0

## 2024-03-12 MED ORDER — LANSOPRAZOLE 30 MG PO CPDR
30.0000 mg | DELAYED_RELEASE_CAPSULE | Freq: Every day | ORAL | 1 refills | Status: AC
  Filled 2024-03-12: qty 30, 30d supply, fill #0

## 2024-03-25 ENCOUNTER — Other Ambulatory Visit (HOSPITAL_COMMUNITY): Payer: Self-pay

## 2024-03-25 MED ORDER — ZEPBOUND 15 MG/0.5ML ~~LOC~~ SOAJ
15.0000 mg | SUBCUTANEOUS | 0 refills | Status: DC
  Filled 2024-03-29: qty 2, 28d supply, fill #0

## 2024-03-29 ENCOUNTER — Other Ambulatory Visit: Payer: Self-pay

## 2024-04-03 ENCOUNTER — Other Ambulatory Visit (HOSPITAL_BASED_OUTPATIENT_CLINIC_OR_DEPARTMENT_OTHER): Payer: Self-pay

## 2024-04-03 MED ORDER — SYNTHROID 175 MCG PO TABS
175.0000 ug | ORAL_TABLET | Freq: Every day | ORAL | 1 refills | Status: AC
  Filled 2024-04-03: qty 30, 30d supply, fill #0

## 2024-04-19 ENCOUNTER — Other Ambulatory Visit (HOSPITAL_BASED_OUTPATIENT_CLINIC_OR_DEPARTMENT_OTHER): Payer: Self-pay

## 2024-04-19 MED ORDER — LANSOPRAZOLE 30 MG PO CPDR
30.0000 mg | DELAYED_RELEASE_CAPSULE | Freq: Every day | ORAL | 1 refills | Status: AC
  Filled 2024-04-19: qty 30, 30d supply, fill #0

## 2024-04-19 MED ORDER — BUPROPION HCL ER (XL) 300 MG PO TB24
300.0000 mg | ORAL_TABLET | Freq: Every morning | ORAL | 0 refills | Status: AC
  Filled 2024-04-19: qty 30, 30d supply, fill #0

## 2024-04-19 MED ORDER — SERTRALINE HCL 50 MG PO TABS
150.0000 mg | ORAL_TABLET | Freq: Every day | ORAL | 0 refills | Status: AC
  Filled 2024-04-19: qty 90, 30d supply, fill #0

## 2024-04-29 ENCOUNTER — Other Ambulatory Visit (HOSPITAL_COMMUNITY): Payer: Self-pay

## 2024-04-30 ENCOUNTER — Encounter (HOSPITAL_COMMUNITY): Payer: Self-pay

## 2024-04-30 ENCOUNTER — Other Ambulatory Visit (HOSPITAL_COMMUNITY): Payer: Self-pay

## 2024-05-03 ENCOUNTER — Other Ambulatory Visit (HOSPITAL_BASED_OUTPATIENT_CLINIC_OR_DEPARTMENT_OTHER): Payer: Self-pay

## 2024-05-14 ENCOUNTER — Other Ambulatory Visit (HOSPITAL_BASED_OUTPATIENT_CLINIC_OR_DEPARTMENT_OTHER): Payer: Self-pay

## 2024-05-14 MED ORDER — SYNTHROID 175 MCG PO TABS
175.0000 ug | ORAL_TABLET | Freq: Every day | ORAL | 1 refills | Status: AC
Start: 2024-05-14 — End: ?
  Filled 2024-05-14: qty 30, 30d supply, fill #0

## 2024-05-14 MED ORDER — SERTRALINE HCL 50 MG PO TABS
150.0000 mg | ORAL_TABLET | Freq: Every day | ORAL | 0 refills | Status: AC
  Filled 2024-05-14: qty 90, 30d supply, fill #0

## 2024-05-14 MED ORDER — LANSOPRAZOLE 30 MG PO CPDR
30.0000 mg | DELAYED_RELEASE_CAPSULE | Freq: Every day | ORAL | 1 refills | Status: AC
  Filled 2024-05-14: qty 30, 30d supply, fill #0

## 2024-05-14 MED ORDER — BUPROPION HCL ER (XL) 300 MG PO TB24
300.0000 mg | ORAL_TABLET | Freq: Every morning | ORAL | 0 refills | Status: AC
  Filled 2024-05-14: qty 30, 30d supply, fill #0
  Filled 2024-06-03: qty 30, 30d supply, fill #1

## 2024-05-22 ENCOUNTER — Other Ambulatory Visit (HOSPITAL_COMMUNITY): Payer: Self-pay

## 2024-05-22 MED ORDER — TIRZEPATIDE-WEIGHT MANAGEMENT 15 MG/0.5ML ~~LOC~~ SOAJ
15.0000 mg | SUBCUTANEOUS | 0 refills | Status: AC
  Filled 2024-05-22: qty 2, 28d supply, fill #0

## 2024-06-03 ENCOUNTER — Other Ambulatory Visit (HOSPITAL_BASED_OUTPATIENT_CLINIC_OR_DEPARTMENT_OTHER): Payer: Self-pay

## 2024-06-14 ENCOUNTER — Other Ambulatory Visit (HOSPITAL_BASED_OUTPATIENT_CLINIC_OR_DEPARTMENT_OTHER): Payer: Self-pay

## 2024-06-14 ENCOUNTER — Other Ambulatory Visit (HOSPITAL_COMMUNITY): Payer: Self-pay

## 2024-06-14 MED ORDER — ZEPBOUND 15 MG/0.5ML ~~LOC~~ SOAJ
0.5000 mL | SUBCUTANEOUS | 0 refills | Status: AC
  Filled 2024-06-14: qty 2, 28d supply, fill #0

## 2024-06-14 MED ORDER — SYNTHROID 175 MCG PO TABS
175.0000 ug | ORAL_TABLET | Freq: Every day | ORAL | 1 refills | Status: AC
  Filled 2024-06-14: qty 30, 30d supply, fill #0

## 2024-06-17 ENCOUNTER — Other Ambulatory Visit: Payer: Self-pay

## 2024-06-17 ENCOUNTER — Other Ambulatory Visit (HOSPITAL_BASED_OUTPATIENT_CLINIC_OR_DEPARTMENT_OTHER): Payer: Self-pay

## 2024-06-17 MED ORDER — LANSOPRAZOLE 30 MG PO CPDR
30.0000 mg | DELAYED_RELEASE_CAPSULE | Freq: Every day | ORAL | 1 refills | Status: AC
  Filled 2024-06-17: qty 30, 30d supply, fill #0

## 2024-06-17 MED ORDER — PROMETHAZINE HCL 25 MG PO TABS
25.0000 mg | ORAL_TABLET | Freq: Four times a day (QID) | ORAL | 0 refills | Status: AC | PRN
  Filled 2024-06-17: qty 20, 5d supply, fill #0

## 2024-06-17 MED ORDER — BUPROPION HCL ER (XL) 300 MG PO TB24
300.0000 mg | ORAL_TABLET | Freq: Every morning | ORAL | 0 refills | Status: AC
  Filled 2024-06-17: qty 30, 30d supply, fill #0

## 2024-06-17 MED ORDER — SERTRALINE HCL 50 MG PO TABS
150.0000 mg | ORAL_TABLET | Freq: Every day | ORAL | 0 refills | Status: AC
  Filled 2024-06-17: qty 90, 30d supply, fill #0

## 2024-06-17 MED ORDER — SYNTHROID 175 MCG PO TABS
175.0000 ug | ORAL_TABLET | Freq: Every day | ORAL | 1 refills | Status: AC
  Filled 2024-06-17: qty 30, 30d supply, fill #0

## 2024-07-12 ENCOUNTER — Other Ambulatory Visit: Payer: Self-pay

## 2024-07-12 ENCOUNTER — Encounter (HOSPITAL_COMMUNITY): Payer: Self-pay

## 2024-07-12 ENCOUNTER — Other Ambulatory Visit (HOSPITAL_BASED_OUTPATIENT_CLINIC_OR_DEPARTMENT_OTHER): Payer: Self-pay

## 2024-07-12 ENCOUNTER — Other Ambulatory Visit (HOSPITAL_COMMUNITY): Payer: Self-pay

## 2024-07-12 MED ORDER — ZEPBOUND 15 MG/0.5ML ~~LOC~~ SOAJ
15.0000 mg | SUBCUTANEOUS | 0 refills | Status: AC
  Filled 2024-07-12: qty 2, 28d supply, fill #0

## 2024-07-12 MED ORDER — LANSOPRAZOLE 30 MG PO CPDR
30.0000 mg | DELAYED_RELEASE_CAPSULE | Freq: Every day | ORAL | 1 refills | Status: AC
  Filled 2024-07-12: qty 30, 30d supply, fill #0

## 2024-07-12 MED ORDER — SYNTHROID 175 MCG PO TABS
175.0000 ug | ORAL_TABLET | Freq: Every day | ORAL | 1 refills | Status: DC
  Filled 2024-07-12: qty 30, 30d supply, fill #0

## 2024-07-17 ENCOUNTER — Other Ambulatory Visit (HOSPITAL_BASED_OUTPATIENT_CLINIC_OR_DEPARTMENT_OTHER): Payer: Self-pay

## 2024-07-17 MED ORDER — BUPROPION HCL ER (XL) 300 MG PO TB24
300.0000 mg | ORAL_TABLET | Freq: Every morning | ORAL | 0 refills | Status: DC
  Filled 2024-07-17: qty 30, 30d supply, fill #0

## 2024-07-17 MED ORDER — SERTRALINE HCL 50 MG PO TABS
150.0000 mg | ORAL_TABLET | Freq: Every day | ORAL | 0 refills | Status: DC
  Filled 2024-07-17: qty 90, 30d supply, fill #0

## 2024-07-18 ENCOUNTER — Other Ambulatory Visit (HOSPITAL_COMMUNITY): Payer: Self-pay

## 2024-07-18 ENCOUNTER — Other Ambulatory Visit (HOSPITAL_BASED_OUTPATIENT_CLINIC_OR_DEPARTMENT_OTHER): Payer: Self-pay

## 2024-07-18 MED ORDER — LEVOTHYROXINE SODIUM 150 MCG PO TABS
150.0000 ug | ORAL_TABLET | Freq: Every day | ORAL | 0 refills | Status: AC
  Filled 2024-07-18: qty 30, 30d supply, fill #0

## 2024-08-01 ENCOUNTER — Other Ambulatory Visit (HOSPITAL_BASED_OUTPATIENT_CLINIC_OR_DEPARTMENT_OTHER): Payer: Self-pay

## 2024-08-02 ENCOUNTER — Other Ambulatory Visit (HOSPITAL_BASED_OUTPATIENT_CLINIC_OR_DEPARTMENT_OTHER): Payer: Self-pay

## 2024-08-02 MED ORDER — SERTRALINE HCL 50 MG PO TABS
150.0000 mg | ORAL_TABLET | Freq: Every day | ORAL | 0 refills | Status: AC
  Filled 2024-08-02: qty 90, 30d supply, fill #0
  Filled ????-??-??: fill #0

## 2024-08-02 MED ORDER — BUPROPION HCL ER (XL) 300 MG PO TB24
300.0000 mg | ORAL_TABLET | Freq: Every morning | ORAL | 0 refills | Status: AC
  Filled 2024-08-02: qty 30, 30d supply, fill #0
  Filled ????-??-??: fill #0
